# Patient Record
Sex: Male | Born: 1965 | Race: Black or African American | Hispanic: No | Marital: Single | Smoking: Never smoker
Health system: Southern US, Community
[De-identification: ages and names within clinical notes are randomized; demographics above are authoritative.]

## PROBLEM LIST (undated history)

## (undated) DIAGNOSIS — E669 Obesity, unspecified: Secondary | ICD-10-CM

## (undated) DIAGNOSIS — E7800 Pure hypercholesterolemia, unspecified: Secondary | ICD-10-CM

## (undated) DIAGNOSIS — E119 Type 2 diabetes mellitus without complications: Secondary | ICD-10-CM

## (undated) DIAGNOSIS — I10 Essential (primary) hypertension: Secondary | ICD-10-CM

## (undated) HISTORY — PX: KNEE ARTHROCENTESIS: SUR44

## (undated) HISTORY — PX: TONSILLECTOMY: SUR1361

## (undated) HISTORY — PX: OTHER SURGICAL HISTORY: SHX169

---

## 2018-08-28 ENCOUNTER — Emergency Department (HOSPITAL_BASED_OUTPATIENT_CLINIC_OR_DEPARTMENT_OTHER)
Admission: EM | Admit: 2018-08-28 | Discharge: 2018-08-28 | Disposition: A | Discharge status: Home / Self Care | Payer: Medicare HMO | Attending: Emergency Medicine | Admitting: Emergency Medicine

## 2018-08-28 ENCOUNTER — Other Ambulatory Visit: Payer: Self-pay

## 2018-08-28 ENCOUNTER — Encounter (HOSPITAL_BASED_OUTPATIENT_CLINIC_OR_DEPARTMENT_OTHER): Payer: Self-pay

## 2018-08-28 DIAGNOSIS — Z7984 Long term (current) use of oral hypoglycemic drugs: Secondary | ICD-10-CM | POA: Diagnosis not present

## 2018-08-28 DIAGNOSIS — W010XXA Fall on same level from slipping, tripping and stumbling without subsequent striking against object, initial encounter: Secondary | ICD-10-CM | POA: Insufficient documentation

## 2018-08-28 DIAGNOSIS — M542 Cervicalgia: Secondary | ICD-10-CM | POA: Insufficient documentation

## 2018-08-28 DIAGNOSIS — G8929 Other chronic pain: Secondary | ICD-10-CM | POA: Insufficient documentation

## 2018-08-28 DIAGNOSIS — M545 Low back pain: Secondary | ICD-10-CM | POA: Diagnosis not present

## 2018-08-28 DIAGNOSIS — I10 Essential (primary) hypertension: Secondary | ICD-10-CM | POA: Insufficient documentation

## 2018-08-28 DIAGNOSIS — Z79899 Other long term (current) drug therapy: Secondary | ICD-10-CM | POA: Diagnosis not present

## 2018-08-28 DIAGNOSIS — E119 Type 2 diabetes mellitus without complications: Secondary | ICD-10-CM | POA: Diagnosis not present

## 2018-08-28 DIAGNOSIS — M25561 Pain in right knee: Secondary | ICD-10-CM | POA: Insufficient documentation

## 2018-08-28 HISTORY — DX: Type 2 diabetes mellitus without complications: E11.9

## 2018-08-28 HISTORY — DX: Obesity, unspecified: E66.9

## 2018-08-28 HISTORY — DX: Essential (primary) hypertension: I10

## 2018-08-28 HISTORY — DX: Pure hypercholesterolemia, unspecified: E78.00

## 2018-08-28 MED ORDER — KETOROLAC TROMETHAMINE 60 MG/2ML IM SOLN
60.0000 mg | Freq: Once | INTRAMUSCULAR | Status: AC
  Administered 2018-08-28: 60 mg via INTRAMUSCULAR
  Filled 2018-08-28: qty 2

## 2018-08-28 MED ORDER — CYCLOBENZAPRINE HCL 10 MG PO TABS
10.0000 mg | ORAL_TABLET | Freq: Once | ORAL | Status: AC
  Administered 2018-08-28: 10 mg via ORAL
  Filled 2018-08-28: qty 1

## 2018-08-28 MED ORDER — CYCLOBENZAPRINE HCL 10 MG PO TABS: 10.0000 mg | ORAL_TABLET | Freq: Two times a day (BID) | ORAL | 0 refills | Status: DC | PRN

## 2018-08-28 NOTE — Discharge Instructions (Addendum)
Your physical exam looks good today. I have no concerns that your accident today caused any new injuries to your knee, back or neck.  I have prescribed you a muscle relaxer to help with neck pain and muscle spasms.  Continue to take your other medications as prescribed and follow-up with your PCP.  It was a pleasure meeting you today. Thank you for allowing me to take care of you. Enjoy your cruise!

## 2018-08-28 NOTE — ED Notes (Signed)
ED Provider at bedside. 

## 2018-08-28 NOTE — ED Triage Notes (Signed)
Pt states tripped over broken pavement, twisted his body trying not to fall and paritially fell in bush. C/o back, neck, rt hip, rt knee pain

## 2018-08-28 NOTE — ED Provider Notes (Signed)
[facility] EMERGENCY DEPARTMENT Provider Note  CSN: [id number] Arrival date & time: 08/28/18  1616    History   Chief Complaint Chief Complaint  Patient presents with  . Fall    HPI Gregory Hampton is a 52 y.o. male with a medical history of Type 2 DM, HTN and OA who presented to the ED following a fall. Patient reports stumbling on concrete outside his home while gardening. He states that he twisted his body and landed in some bushes. He denies any contact with a hard surface. Currently endorses right knee, low back and neck pain. At baseline, patient has chronic pain in these areas due to arthritis and past injuries and the pain causes him to ambulate with a cane. Denies hearing or feeling any pops, snaps or breaks at the time of the incident. Denies any preceding symptoms that caused the fall such as paresthesias, weakness, vision changes, chest pain, SOB, palpitations, dizziness, lightheadedness or  Syncope. Patient has tried his Rx Tramadol for pain relief prior to arrival with minimal relief.  Past Medical History:  Diagnosis Date  . Diabetes mellitus without complication (HCC)   . High cholesterol   . Hypertension   . Obesity     There are no active problems to display for this patient.   Past Surgical History:  Procedure Laterality Date  . arm surgery    . KNEE ARTHROCENTESIS    . TONSILLECTOMY          Home Medications    Prior to Admission medications   Medication Sig Start Date End Date Taking? Authorizing Provider  atenolol (TENORMIN) 25 MG tablet Take by mouth daily.   Yes [provider]  gabapentin (NEURONTIN) 600 MG tablet Take 600 mg by mouth 3 (three) times daily.   Yes [provider]  hydrochlorothiazide (HYDRODIURIL) 25 MG tablet Take 25 mg by mouth daily.   Yes [provider]  lisinopril (PRINIVIL,ZESTRIL) 5 MG tablet Take 5 mg by mouth daily.   Yes [provider]  lovastatin (MEVACOR) 20 MG tablet  Take 20 mg by mouth at bedtime.   Yes [provider]  metFORMIN (GLUCOPHAGE) 500 MG tablet Take by mouth 2 (two) times daily with a meal.   Yes [provider]  phentermine 37.5 MG capsule Take 37.5 mg by mouth every morning.   Yes [provider]  traMADol (ULTRAM) 50 MG tablet Take by mouth every 6 (six) hours as needed.   Yes [provider]  cyclobenzaprine (FLEXERIL) 10 MG tablet Take 1 tablet (10 mg total) by mouth 2 (two) times daily as needed for muscle spasms. 08/28/18   Raigen Jagielski, Sharyon Medicus, PA-C    Family History No family history on file.  Social History Social History   Tobacco Use  . Smoking status: Never Smoker  . Smokeless tobacco: Never Used  Substance Use Topics  . Alcohol use: Not Currently  . Drug use: Not Currently     Allergies   Patient has no allergy information on record.   Review of Systems Review of Systems  Constitutional: Negative.   Eyes: Negative for visual disturbance.  Respiratory: Negative for chest tightness and shortness of breath.   Cardiovascular: Negative for chest pain and palpitations.  Musculoskeletal: Positive for arthralgias, back pain and neck pain.  Skin: Negative.   Neurological: Negative for dizziness, weakness, light-headedness and numbness.   Physical Exam Updated Vital Signs BP 107/76 (BP Location: Right Arm)   Pulse 76   Temp  98.4 F (36.9 C) (Oral)   Resp 20   SpO2 93%   Physical Exam  Constitutional: Vital signs are normal.  Obese. Sitting comfortably on bed.  Neck: Normal range of motion. Neck supple. Muscular tenderness present. No spinous process tenderness present. Normal range of motion present.  Cardiovascular: Normal rate, regular rhythm and normal heart sounds.  Pulmonary/Chest: Effort normal and breath sounds normal.  Musculoskeletal:  Full ROM in upper and lower extremities bilaterally with 5/5 strength. Negative ligamental laxity tests in right knee. No midline  tenderness of the spine. Right sided paraspinal muscular tenderness of c-spine and lumbar region. Able to bear weight and ambulate with cane as he does normally.  Neurological: He has normal strength and normal reflexes. No sensory deficit. He exhibits normal muscle tone. Gait normal.  Skin: Skin is warm and intact. Capillary refill takes less than 2 seconds. No abrasion and no bruising noted.  Nursing note and vitals reviewed.  ED Treatments / Results  Labs (all labs ordered are listed, but only abnormal results are displayed) Labs Reviewed - No data to display  EKG None  Radiology No results found.  Procedures Procedures (including critical care time)  Medications Ordered in ED Medications  ketorolac (TORADOL) injection 60 mg (60 mg Intramuscular Given 08/28/18 1826)  cyclobenzaprine (FLEXERIL) tablet 10 mg (10 mg Oral Given 08/28/18 1913)     Initial Impression / Assessment and Plan / ED Course  Triage vital signs and the nursing notes have been reviewed.  Pertinent labs & imaging results that were available during care of the patient were reviewed and considered in medical decision making (see chart for details).   Patient presents following a Curator stumble where he twisted his body awkwardly and landed in bushes to avoid falling on the ground. He is currently in no acute distress and is well appearing. At baseline, he has chronic knee, low back and neck pain and his presentation today is a reported acute worsening of these same areas. Physical exam is unremarkable. There are no findings that suggest fracture, dislocation or significant/new injury to these areas that would warrant imaging.  No deformities, decreased muscle tone or other abnormalities visualized. Neurovascular function is intact throughout. There are no other physical exam findings or s/s that suggest an underlying infectious or rheumatologic process that warrant further evaluation or intervention today.  Improvement in pain achieved with IM Toradol and Flexeril today.  Final Clinical Impressions(s) / ED Diagnoses  1. Fall. Associated right knee, neck and back pain. Education provided on OTC and supportive treatment for pain and relief. Advised to continue with home pain medications as prescribed. Advised to follow-up with PCP or orthopedist.  Dispo: Home. After thorough clinical evaluation, this patient is determined to be medically stable and can be safely discharged with the previously mentioned treatment and/or outpatient follow-up/referral(s). At this time, there are no other apparent medical conditions that require further screening, evaluation or treatment.   Final diagnoses:  Chronic pain of right knee  Chronic bilateral low back pain without sciatica  Neck pain, chronic    ED Discharge Orders         Ordered    cyclobenzaprine (FLEXERIL) 10 MG tablet  2 times daily PRN     08/28/18 1944            Ainslee Sou, Roanna Raider 08/28/18 2045    Gwyneth Sprout, MD 08/28/18 [phone]

## 2020-01-21 ENCOUNTER — Emergency Department (HOSPITAL_BASED_OUTPATIENT_CLINIC_OR_DEPARTMENT_OTHER): Payer: Medicare HMO

## 2020-01-21 ENCOUNTER — Other Ambulatory Visit: Payer: Self-pay

## 2020-01-21 ENCOUNTER — Telehealth (HOSPITAL_BASED_OUTPATIENT_CLINIC_OR_DEPARTMENT_OTHER): Payer: Self-pay | Admitting: Emergency Medicine

## 2020-01-21 ENCOUNTER — Encounter (HOSPITAL_BASED_OUTPATIENT_CLINIC_OR_DEPARTMENT_OTHER): Payer: Self-pay | Admitting: Emergency Medicine

## 2020-01-21 ENCOUNTER — Inpatient Hospital Stay (HOSPITAL_BASED_OUTPATIENT_CLINIC_OR_DEPARTMENT_OTHER)
Admission: EM | Admit: 2020-01-21 | Discharge: 2020-02-17 | DRG: 207 | Disposition: E | Payer: Medicare HMO | Attending: Internal Medicine | Admitting: Internal Medicine

## 2020-01-21 DIAGNOSIS — J1282 Pneumonia due to coronavirus disease 2019: Secondary | ICD-10-CM | POA: Diagnosis not present

## 2020-01-21 DIAGNOSIS — E7800 Pure hypercholesterolemia, unspecified: Secondary | ICD-10-CM | POA: Diagnosis present

## 2020-01-21 DIAGNOSIS — E872 Acidosis: Secondary | ICD-10-CM | POA: Diagnosis not present

## 2020-01-21 DIAGNOSIS — I462 Cardiac arrest due to underlying cardiac condition: Secondary | ICD-10-CM | POA: Diagnosis not present

## 2020-01-21 DIAGNOSIS — Z79899 Other long term (current) drug therapy: Secondary | ICD-10-CM | POA: Diagnosis not present

## 2020-01-21 DIAGNOSIS — Z91018 Allergy to other foods: Secondary | ICD-10-CM | POA: Diagnosis not present

## 2020-01-21 DIAGNOSIS — R6521 Severe sepsis with septic shock: Secondary | ICD-10-CM | POA: Diagnosis not present

## 2020-01-21 DIAGNOSIS — Z01818 Encounter for other preprocedural examination: Secondary | ICD-10-CM

## 2020-01-21 DIAGNOSIS — Y848 Other medical procedures as the cause of abnormal reaction of the patient, or of later complication, without mention of misadventure at the time of the procedure: Secondary | ICD-10-CM | POA: Diagnosis not present

## 2020-01-21 DIAGNOSIS — E1169 Type 2 diabetes mellitus with other specified complication: Secondary | ICD-10-CM | POA: Diagnosis present

## 2020-01-21 DIAGNOSIS — L8990 Pressure ulcer of unspecified site, unspecified stage: Secondary | ICD-10-CM | POA: Insufficient documentation

## 2020-01-21 DIAGNOSIS — Z66 Do not resuscitate: Secondary | ICD-10-CM | POA: Diagnosis not present

## 2020-01-21 DIAGNOSIS — E785 Hyperlipidemia, unspecified: Secondary | ICD-10-CM | POA: Diagnosis not present

## 2020-01-21 DIAGNOSIS — I4510 Unspecified right bundle-branch block: Secondary | ICD-10-CM | POA: Diagnosis present

## 2020-01-21 DIAGNOSIS — U071 COVID-19: Principal | ICD-10-CM | POA: Diagnosis present

## 2020-01-21 DIAGNOSIS — L89812 Pressure ulcer of head, stage 2: Secondary | ICD-10-CM | POA: Diagnosis not present

## 2020-01-21 DIAGNOSIS — T4275XA Adverse effect of unspecified antiepileptic and sedative-hypnotic drugs, initial encounter: Secondary | ICD-10-CM | POA: Diagnosis not present

## 2020-01-21 DIAGNOSIS — J80 Acute respiratory distress syndrome: Secondary | ICD-10-CM | POA: Diagnosis not present

## 2020-01-21 DIAGNOSIS — A419 Sepsis, unspecified organism: Secondary | ICD-10-CM | POA: Diagnosis not present

## 2020-01-21 DIAGNOSIS — Z79891 Long term (current) use of opiate analgesic: Secondary | ICD-10-CM

## 2020-01-21 DIAGNOSIS — E669 Obesity, unspecified: Secondary | ICD-10-CM | POA: Diagnosis not present

## 2020-01-21 DIAGNOSIS — J9601 Acute respiratory failure with hypoxia: Secondary | ICD-10-CM | POA: Diagnosis not present

## 2020-01-21 DIAGNOSIS — R0600 Dyspnea, unspecified: Secondary | ICD-10-CM | POA: Diagnosis not present

## 2020-01-21 DIAGNOSIS — Z6841 Body Mass Index (BMI) 40.0 and over, adult: Secondary | ICD-10-CM

## 2020-01-21 DIAGNOSIS — T85598A Other mechanical complication of other gastrointestinal prosthetic devices, implants and grafts, initial encounter: Secondary | ICD-10-CM

## 2020-01-21 DIAGNOSIS — R001 Bradycardia, unspecified: Secondary | ICD-10-CM | POA: Diagnosis not present

## 2020-01-21 DIAGNOSIS — E1165 Type 2 diabetes mellitus with hyperglycemia: Secondary | ICD-10-CM | POA: Diagnosis not present

## 2020-01-21 DIAGNOSIS — M549 Dorsalgia, unspecified: Secondary | ICD-10-CM | POA: Diagnosis present

## 2020-01-21 DIAGNOSIS — E875 Hyperkalemia: Secondary | ICD-10-CM | POA: Diagnosis not present

## 2020-01-21 DIAGNOSIS — I10 Essential (primary) hypertension: Secondary | ICD-10-CM | POA: Diagnosis present

## 2020-01-21 DIAGNOSIS — R000 Tachycardia, unspecified: Secondary | ICD-10-CM | POA: Diagnosis not present

## 2020-01-21 DIAGNOSIS — F329 Major depressive disorder, single episode, unspecified: Secondary | ICD-10-CM | POA: Diagnosis present

## 2020-01-21 DIAGNOSIS — Z7984 Long term (current) use of oral hypoglycemic drugs: Secondary | ICD-10-CM

## 2020-01-21 DIAGNOSIS — J181 Lobar pneumonia, unspecified organism: Secondary | ICD-10-CM | POA: Diagnosis not present

## 2020-01-21 DIAGNOSIS — N179 Acute kidney failure, unspecified: Secondary | ICD-10-CM | POA: Diagnosis not present

## 2020-01-21 DIAGNOSIS — E119 Type 2 diabetes mellitus without complications: Secondary | ICD-10-CM | POA: Diagnosis not present

## 2020-01-21 DIAGNOSIS — J9600 Acute respiratory failure, unspecified whether with hypoxia or hypercapnia: Secondary | ICD-10-CM | POA: Diagnosis not present

## 2020-01-21 DIAGNOSIS — J9382 Other air leak: Secondary | ICD-10-CM | POA: Diagnosis not present

## 2020-01-21 DIAGNOSIS — F419 Anxiety disorder, unspecified: Secondary | ICD-10-CM | POA: Diagnosis present

## 2020-01-21 DIAGNOSIS — R579 Shock, unspecified: Secondary | ICD-10-CM | POA: Diagnosis not present

## 2020-01-21 DIAGNOSIS — J9602 Acute respiratory failure with hypercapnia: Secondary | ICD-10-CM | POA: Diagnosis not present

## 2020-01-21 DIAGNOSIS — Z9911 Dependence on respirator [ventilator] status: Secondary | ICD-10-CM | POA: Diagnosis not present

## 2020-01-21 DIAGNOSIS — G8929 Other chronic pain: Secondary | ICD-10-CM | POA: Diagnosis present

## 2020-01-21 DIAGNOSIS — R0902 Hypoxemia: Secondary | ICD-10-CM

## 2020-01-21 DIAGNOSIS — Z4659 Encounter for fitting and adjustment of other gastrointestinal appliance and device: Secondary | ICD-10-CM

## 2020-01-21 DIAGNOSIS — R319 Hematuria, unspecified: Secondary | ICD-10-CM | POA: Diagnosis not present

## 2020-01-21 DIAGNOSIS — T886XXA Anaphylactic reaction due to adverse effect of correct drug or medicament properly administered, initial encounter: Secondary | ICD-10-CM | POA: Diagnosis not present

## 2020-01-21 DIAGNOSIS — R739 Hyperglycemia, unspecified: Secondary | ICD-10-CM | POA: Diagnosis not present

## 2020-01-21 DIAGNOSIS — E781 Pure hyperglyceridemia: Secondary | ICD-10-CM | POA: Diagnosis present

## 2020-01-21 LAB — COMPREHENSIVE METABOLIC PANEL
ALT: 33 U/L (ref 0–44)
AST: 54 U/L — ABNORMAL HIGH (ref 15–41)
Albumin: 3.6 g/dL (ref 3.5–5.0)
Alkaline Phosphatase: 53 U/L (ref 38–126)
Anion gap: 12 (ref 5–15)
BUN: 15 mg/dL (ref 6–20)
CO2: 27 mmol/L (ref 22–32)
Calcium: 9.6 mg/dL (ref 8.9–10.3)
Chloride: 94 mmol/L — ABNORMAL LOW (ref 98–111)
Creatinine, Ser: 1.26 mg/dL — ABNORMAL HIGH (ref 0.61–1.24)
GFR calc Af Amer: >60 mL/min (ref >60)
GFR calc non Af Amer: >60 mL/min (ref >60)
Glucose, Bld: 154 mg/dL — ABNORMAL HIGH (ref 70–99)
Potassium: 3.7 mmol/L (ref 3.5–5.1)
Sodium: 133 mmol/L — ABNORMAL LOW (ref 135–145)
Total Bilirubin: 0.7 mg/dL (ref 0.3–1.2)
Total Protein: 8.8 g/dL — ABNORMAL HIGH (ref 6.5–8.1)

## 2020-01-21 LAB — HEPATITIS B SURFACE ANTIGEN: Hepatitis B Surface Ag: NONREACTIVE

## 2020-01-21 LAB — CBC WITH DIFFERENTIAL/PLATELET
Abs Immature Granulocytes: 0.1 10*3/uL — ABNORMAL HIGH (ref 0.00–0.07)
Basophils Absolute: 0 10*3/uL (ref 0.0–0.1)
Basophils Relative: 0 %
Eosinophils Absolute: 0 10*3/uL (ref 0.0–0.5)
Eosinophils Relative: 0 %
HCT: 39.2 % (ref 39.0–52.0)
Hemoglobin: 12.2 g/dL — ABNORMAL LOW (ref 13.0–17.0)
Immature Granulocytes: 1 %
Lymphocytes Relative: 5 %
Lymphs Abs: 0.8 10*3/uL (ref 0.7–4.0)
MCH: 29.7 pg (ref 26.0–34.0)
MCHC: 31.1 g/dL (ref 30.0–36.0)
MCV: 95.4 fL (ref 80.0–100.0)
Monocytes Absolute: 1.1 10*3/uL — ABNORMAL HIGH (ref 0.1–1.0)
Monocytes Relative: 8 %
Neutro Abs: 12 10*3/uL — ABNORMAL HIGH (ref 1.7–7.7)
Neutrophils Relative %: 86 %
Platelets: 268 10*3/uL (ref 150–400)
RBC: 4.11 MIL/uL — ABNORMAL LOW (ref 4.22–5.81)
RDW: 13.5 % (ref 11.5–15.5)
WBC: 13.9 10*3/uL — ABNORMAL HIGH (ref 4.0–10.5)
nRBC: 0 % (ref 0.0–0.2)

## 2020-01-21 LAB — PROCALCITONIN: Procalcitonin: 0.5 ng/mL

## 2020-01-21 LAB — GLUCOSE, CAPILLARY
Glucose-Capillary: 141 mg/dL — ABNORMAL HIGH (ref 70–99)
Glucose-Capillary: 159 mg/dL — ABNORMAL HIGH (ref 70–99)
Glucose-Capillary: 203 mg/dL — ABNORMAL HIGH (ref 70–99)
Glucose-Capillary: 157 mg/dL — ABNORMAL HIGH (ref 70–99)

## 2020-01-21 LAB — BRAIN NATRIURETIC PEPTIDE: B Natriuretic Peptide: 29.7 pg/mL (ref 0.0–100.0)

## 2020-01-21 LAB — FIBRINOGEN: Fibrinogen: >800 mg/dL — ABNORMAL HIGH (ref 210–475)

## 2020-01-21 LAB — ABO/RH: ABO/RH(D): O POS

## 2020-01-21 LAB — TRIGLYCERIDES: Triglycerides: 89 mg/dL (ref <150)

## 2020-01-21 LAB — D-DIMER, QUANTITATIVE: D-Dimer, Quant: 0.46 ug/mL-FEU (ref 0.00–0.50)

## 2020-01-21 LAB — C-REACTIVE PROTEIN: CRP: 26.9 mg/dL — ABNORMAL HIGH (ref <1.0)

## 2020-01-21 LAB — FERRITIN: Ferritin: 163 ng/mL (ref 24–336)

## 2020-01-21 LAB — HIV ANTIBODY (ROUTINE TESTING W REFLEX): HIV Screen 4th Generation wRfx: NONREACTIVE

## 2020-01-21 LAB — LACTIC ACID, PLASMA: Lactic Acid, Venous: 1.9 mmol/L (ref 0.5–1.9)

## 2020-01-21 LAB — TROPONIN I (HIGH SENSITIVITY): Troponin I (High Sensitivity): 11 ng/L (ref <18)

## 2020-01-21 LAB — SARS CORONAVIRUS 2 AG (30 MIN TAT): SARS Coronavirus 2 Ag: POSITIVE — AB

## 2020-01-21 LAB — LACTATE DEHYDROGENASE: LDH: 327 U/L — ABNORMAL HIGH (ref 98–192)

## 2020-01-21 MED ORDER — SODIUM CHLORIDE 0.9 % IV SOLN
2.0000 g | INTRAVENOUS | Status: AC
  Administered 2020-01-22 – 2020-01-25 (×4): 2 g via INTRAVENOUS
  Filled 2020-01-21 (×4): qty 20

## 2020-01-21 MED ORDER — INSULIN ASPART 100 UNIT/ML ~~LOC~~ SOLN
0.0000 [IU] | SUBCUTANEOUS | Status: DC
  Administered 2020-01-21: 17:00:00 3 [IU] via SUBCUTANEOUS
  Administered 2020-01-21: 20:00:00 5 [IU] via SUBCUTANEOUS
  Administered 2020-01-21: 15:00:00 2 [IU] via SUBCUTANEOUS
  Administered 2020-01-22 (×3): 3 [IU] via SUBCUTANEOUS
  Administered 2020-01-22: 09:00:00 2 [IU] via SUBCUTANEOUS
  Administered 2020-01-22 – 2020-01-25 (×17): 3 [IU] via SUBCUTANEOUS
  Administered 2020-01-25: 01:00:00 5 [IU] via SUBCUTANEOUS
  Administered 2020-01-26 (×7): 3 [IU] via SUBCUTANEOUS
  Administered 2020-01-27: 08:00:00 5 [IU] via SUBCUTANEOUS
  Administered 2020-01-27: 20:00:00 3 [IU] via SUBCUTANEOUS
  Administered 2020-01-27: 16:00:00 5 [IU] via SUBCUTANEOUS
  Administered 2020-01-27 (×3): 3 [IU] via SUBCUTANEOUS
  Administered 2020-01-28: 09:00:00 11 [IU] via SUBCUTANEOUS
  Administered 2020-01-28: 04:00:00 5 [IU] via SUBCUTANEOUS
  Administered 2020-01-28: 12:00:00 11 [IU] via SUBCUTANEOUS

## 2020-01-21 MED ORDER — SODIUM CHLORIDE 0.9 % IV SOLN
100.0000 mg | INTRAVENOUS | Status: AC
  Administered 2020-01-21 (×2): 100 mg via INTRAVENOUS
  Filled 2020-01-21 (×2): qty 20

## 2020-01-21 MED ORDER — ENOXAPARIN SODIUM 100 MG/ML ~~LOC~~ SOLN
100.0000 mg | Freq: Once | SUBCUTANEOUS | Status: AC
  Administered 2020-01-21: 14:00:00 100 mg via SUBCUTANEOUS
  Filled 2020-01-21: qty 1

## 2020-01-21 MED ORDER — ATENOLOL 25 MG PO TABS
25.0000 mg | ORAL_TABLET | Freq: Every day | ORAL | Status: DC
  Administered 2020-01-21 – 2020-01-25 (×5): 25 mg via ORAL
  Filled 2020-01-21 (×6): qty 1

## 2020-01-21 MED ORDER — METHYLPREDNISOLONE SODIUM SUCC 125 MG IJ SOLR
70.0000 mg | Freq: Three times a day (TID) | INTRAMUSCULAR | Status: DC
  Administered 2020-01-21 – 2020-01-27 (×17): 70 mg via INTRAVENOUS
  Filled 2020-01-21 (×17): qty 2

## 2020-01-21 MED ORDER — ASCORBIC ACID 500 MG PO TABS
500.0000 mg | ORAL_TABLET | Freq: Every day | ORAL | Status: DC
  Administered 2020-01-21 – 2020-01-25 (×5): 500 mg via ORAL
  Filled 2020-01-21 (×5): qty 1

## 2020-01-21 MED ORDER — SODIUM CHLORIDE 0.9 % IV SOLN
100.0000 mg | Freq: Every day | INTRAVENOUS | Status: AC
  Administered 2020-01-22 – 2020-01-25 (×4): 100 mg via INTRAVENOUS
  Filled 2020-01-21 (×4): qty 20

## 2020-01-21 MED ORDER — PANTOPRAZOLE SODIUM 40 MG PO TBEC
40.0000 mg | DELAYED_RELEASE_TABLET | Freq: Every day | ORAL | Status: DC
  Administered 2020-01-21 – 2020-01-25 (×5): 40 mg via ORAL
  Filled 2020-01-21 (×5): qty 1

## 2020-01-21 MED ORDER — POLYETHYLENE GLYCOL 3350 17 G PO PACK
17.0000 g | PACK | Freq: Every day | ORAL | Status: DC | PRN
  Administered 2020-01-28: 11:00:00 17 g via ORAL
  Filled 2020-01-21: qty 1

## 2020-01-21 MED ORDER — DEXAMETHASONE SODIUM PHOSPHATE 10 MG/ML IJ SOLN
6.0000 mg | Freq: Once | INTRAMUSCULAR | Status: AC
  Administered 2020-01-21: 6 mg via INTRAVENOUS
  Filled 2020-01-21: qty 1

## 2020-01-21 MED ORDER — ZINC SULFATE 220 (50 ZN) MG PO CAPS
220.0000 mg | ORAL_CAPSULE | Freq: Every day | ORAL | Status: DC
  Administered 2020-01-21 – 2020-01-25 (×5): 220 mg via ORAL
  Filled 2020-01-21 (×5): qty 1

## 2020-01-21 MED ORDER — LINAGLIPTIN 5 MG PO TABS
5.0000 mg | ORAL_TABLET | Freq: Every day | ORAL | Status: DC
  Administered 2020-01-21 – 2020-01-25 (×5): 5 mg via ORAL
  Filled 2020-01-21 (×5): qty 1

## 2020-01-21 MED ORDER — PRAVASTATIN SODIUM 20 MG PO TABS
20.0000 mg | ORAL_TABLET | Freq: Every day | ORAL | Status: DC
  Administered 2020-01-21 – 2020-01-23 (×3): 20 mg via ORAL
  Filled 2020-01-21: qty 1
  Filled 2020-01-21 (×3): qty 2

## 2020-01-21 MED ORDER — ENOXAPARIN SODIUM 100 MG/ML ~~LOC~~ SOLN
100.0000 mg | Freq: Two times a day (BID) | SUBCUTANEOUS | Status: DC
  Administered 2020-01-22 – 2020-01-26 (×9): 100 mg via SUBCUTANEOUS
  Filled 2020-01-21 (×14): qty 1

## 2020-01-21 MED ORDER — CITALOPRAM HYDROBROMIDE 20 MG PO TABS
20.0000 mg | ORAL_TABLET | Freq: Every day | ORAL | Status: DC
  Administered 2020-01-21 – 2020-01-24 (×4): 20 mg via ORAL
  Filled 2020-01-21: qty 2
  Filled 2020-01-21: qty 1
  Filled 2020-01-21: qty 2
  Filled 2020-01-21: qty 1
  Filled 2020-01-21: qty 2

## 2020-01-21 MED ORDER — SODIUM CHLORIDE 0.9 % IV SOLN
INTRAVENOUS | Status: AC
  Administered 2020-01-21: 500 mg via INTRAVENOUS
  Filled 2020-01-21: qty 500

## 2020-01-21 MED ORDER — SODIUM CHLORIDE 0.9% IV SOLUTION: Freq: Once | INTRAVENOUS | Status: AC

## 2020-01-21 MED ORDER — SODIUM CHLORIDE 0.9 % IV SOLN
500.0000 mg | Freq: Once | INTRAVENOUS | Status: AC
  Administered 2020-01-21: 11:00:00 500 mg via INTRAVENOUS

## 2020-01-21 MED ORDER — METHYLPREDNISOLONE SODIUM SUCC 125 MG IJ SOLR
60.0000 mg | Freq: Three times a day (TID) | INTRAMUSCULAR | Status: DC
  Filled 2020-01-21: qty 2

## 2020-01-21 MED ORDER — TOCILIZUMAB 400 MG/20ML IV SOLN
800.0000 mg | Freq: Once | INTRAVENOUS | Status: AC
  Administered 2020-01-21: 800 mg via INTRAVENOUS
  Filled 2020-01-21: qty 40

## 2020-01-21 MED ORDER — ACETAMINOPHEN 325 MG PO TABS
650.0000 mg | ORAL_TABLET | Freq: Four times a day (QID) | ORAL | Status: DC | PRN
  Administered 2020-01-21 – 2020-02-01 (×13): 650 mg via ORAL
  Filled 2020-01-21 (×12): qty 2

## 2020-01-21 MED ORDER — ACETAMINOPHEN 325 MG PO TABS
650.0000 mg | ORAL_TABLET | Freq: Once | ORAL | Status: AC
  Administered 2020-01-21: 650 mg via ORAL
  Filled 2020-01-21: qty 2

## 2020-01-21 MED ORDER — AZITHROMYCIN 500 MG PO TABS
500.0000 mg | ORAL_TABLET | Freq: Every day | ORAL | Status: AC
  Administered 2020-01-22 – 2020-01-25 (×4): 500 mg via ORAL
  Filled 2020-01-21: qty 2
  Filled 2020-01-21: qty 1
  Filled 2020-01-21 (×2): qty 2

## 2020-01-21 MED ORDER — SODIUM CHLORIDE 0.9 % IV SOLN
1.0000 g | Freq: Once | INTRAVENOUS | Status: AC
  Administered 2020-01-21: 1 g via INTRAVENOUS
  Filled 2020-01-21: qty 10

## 2020-01-21 NOTE — ED Notes (Signed)
Pt sister Hady Niemczyk picked up pt home meds 02/14/2020 at 15:45

## 2020-01-21 NOTE — ED Triage Notes (Signed)
Presents with SOB, seen at PCP yesterday, worse shortness of breath today with chest pain.

## 2020-01-21 NOTE — Progress Notes (Signed)
RT evaluated for need of heated high flow pt per request of Dr. Lowell Guitar. Pt on 15L HFNC and 15L NRB. SpO2 94%, RR 24, HR 98. Pt in no distress at this time. RT will continue to monitor.

## 2020-01-21 NOTE — ED Provider Notes (Signed)
[facility] EMERGENCY DEPARTMENT Provider Note   CSN: [id number] Arrival date & time: 2020-02-12  7564     History No chief complaint on file.   Gregory Hampton is a 54 y.o. male.  He has a history of diabetes and hypertension high cholesterol.  Not normally require oxygen.  Went to his primary care doctor yesterday for cough and shortness of breath and found to be hypoxic.  Doctor recommended he come to the emergency department but the patient declined.  Sats were in the 60s and 70s at home so used his mother's oxygen.  Tank ran out this morning so he decided to come to the emergency department.  Complaining of headache and shortness of breath.  No chest pain.  Nonproductive cough.  No runny nose sore throat or diarrhea.  No known Covid exposures.  The history is provided by the patient.  Shortness of Breath Severity:  Severe Onset quality:  Gradual Duration:  2 days Timing:  Constant Progression:  Worsening Chronicity:  New Context: URI   Relieved by:  Nothing Worsened by:  Activity Ineffective treatments:  Oxygen Associated symptoms: cough and headaches   Associated symptoms: no abdominal pain, no chest pain, no fever, no hemoptysis, no rash, no sore throat, no sputum production, no syncope and no vomiting   Risk factors: obesity   Risk factors: no hx of PE/DVT        Past Medical History:  Diagnosis Date  . Diabetes mellitus without complication (HCC)   . High cholesterol   . Hypertension   . Obesity     There are no problems to display for this patient.   Past Surgical History:  Procedure Laterality Date  . arm surgery    . KNEE ARTHROCENTESIS    . TONSILLECTOMY         No family history on file.  Social History   Tobacco Use  . Smoking status: Never Smoker  . Smokeless tobacco: Never Used  Substance Use Topics  . Alcohol use: Not Currently  . Drug use: Not Currently    Home Medications Prior to Admission medications   Medication Sig  Start Date End Date Taking? Authorizing Provider  atenolol (TENORMIN) 25 MG tablet Take by mouth daily.    [provider]  cyclobenzaprine (FLEXERIL) 10 MG tablet Take 1 tablet (10 mg total) by mouth 2 (two) times daily as needed for muscle spasms. 08/28/18   Mortis, Jerrel Ivory I, PA-C  gabapentin (NEURONTIN) 600 MG tablet Take 600 mg by mouth 3 (three) times daily.    [provider]  hydrochlorothiazide (HYDRODIURIL) 25 MG tablet Take 25 mg by mouth daily.    [provider]  lisinopril (PRINIVIL,ZESTRIL) 5 MG tablet Take 5 mg by mouth daily.    [provider]  lovastatin (MEVACOR) 20 MG tablet Take 20 mg by mouth at bedtime.    [provider]  metFORMIN (GLUCOPHAGE) 500 MG tablet Take by mouth 2 (two) times daily with a meal.    [provider]  phentermine 37.5 MG capsule Take 37.5 mg by mouth every morning.    [provider]  traMADol (ULTRAM) 50 MG tablet Take by mouth every 6 (six) hours as needed.    [provider]    Allergies    Patient has no allergy information on record.  Review of Systems   Review of Systems  Constitutional: Negative for fever.  HENT: Negative for sore throat.   Eyes: Negative for visual disturbance.  Respiratory: Positive for cough and shortness of breath. Negative for hemoptysis and sputum production.   Cardiovascular: Negative for chest pain and syncope.  Gastrointestinal: Negative for abdominal pain and vomiting.  Genitourinary: Negative for dysuria.  Musculoskeletal: Positive for back pain.  Skin: Negative for rash.  Neurological: Positive for headaches.    Physical Exam Updated Vital Signs BP 129/86 (BP Location: Right Arm)   Pulse 90   Temp 98.9 F (37.2 C) (Oral)   Resp (!) 28   Ht 5\' 10"  (1.778 m)   Wt (!) 203.2 kg   SpO2 95%   BMI 64.28 kg/m   Physical Exam Vitals and nursing note reviewed.  Constitutional:      Appearance: He is well-developed. He is obese.   HENT:     Head: Normocephalic and atraumatic.  Eyes:     Conjunctiva/sclera: Conjunctivae normal.  Cardiovascular:     Rate and Rhythm: Regular rhythm. Tachycardia present.     Pulses: Normal pulses.     Heart sounds: No murmur.  Pulmonary:     Effort: Tachypnea, accessory muscle usage and respiratory distress present.     Breath sounds: Decreased breath sounds present.  Abdominal:     Palpations: Abdomen is soft.     Tenderness: There is no abdominal tenderness.  Musculoskeletal:     Cervical back: Neck supple.  Skin:    General: Skin is warm and dry.  Neurological:     Mental Status: He is alert.     ED Results / Procedures / Treatments   Labs (all labs ordered are listed, but only abnormal results are displayed) Labs Reviewed  SARS CORONAVIRUS 2 AG (30 MIN TAT) - Abnormal; Notable for the following components:      Result Value   SARS Coronavirus 2 Ag POSITIVE (*)    All other components within normal limits  CBC WITH DIFFERENTIAL/PLATELET - Abnormal; Notable for the following components:   WBC 13.9 (*)    RBC 4.11 (*)    Hemoglobin 12.2 (*)    Neutro Abs 12.0 (*)    Monocytes Absolute 1.1 (*)    Abs Immature Granulocytes 0.10 (*)    All other components within normal limits  COMPREHENSIVE METABOLIC PANEL - Abnormal; Notable for the following components:   Sodium 133 (*)    Chloride 94 (*)    Glucose, Bld 154 (*)    Creatinine, Ser 1.26 (*)    Total Protein 8.8 (*)    AST 54 (*)    All other components within normal limits  LACTATE DEHYDROGENASE - Abnormal; Notable for the following components:   LDH 327 (*)    All other components within normal limits  FIBRINOGEN - Abnormal; Notable for the following components:   Fibrinogen >800 (*)    All other components within normal limits  C-REACTIVE PROTEIN - Abnormal; Notable for the following components:   CRP 26.9 (*)    All other components within normal limits  GLUCOSE, CAPILLARY - Abnormal; Notable for the  following components:   Glucose-Capillary 141 (*)    All other components within normal limits  GLUCOSE, CAPILLARY - Abnormal; Notable for the following components:   Glucose-Capillary 159 (*)    All other components within normal limits  CULTURE, BLOOD (ROUTINE X 2)  CULTURE, BLOOD (ROUTINE X 2)  LACTIC ACID, PLASMA  D-DIMER, QUANTITATIVE (NOT AT [facility])  PROCALCITONIN  FERRITIN  TRIGLYCERIDES  BRAIN NATRIURETIC PEPTIDE  HIV ANTIBODY (ROUTINE TESTING W REFLEX)  HEPATITIS B SURFACE ANTIGEN  HEPATITIS B SURFACE ANTIBODY, QUANTITATIVE  CBC WITH DIFFERENTIAL/PLATELET  COMPREHENSIVE METABOLIC PANEL  C-REACTIVE PROTEIN  D-DIMER, QUANTITATIVE (NOT AT [facility])  FERRITIN  MAGNESIUM  PHOSPHORUS  ABO/RH  PREPARE FRESH FROZEN PLASMA  TROPONIN I (HIGH SENSITIVITY)    EKG EKG Interpretation  Date/Time:  Tuesday January 21 2020 09:01:43 EST Ventricular Rate:  122 PR Interval:    QRS Duration: 133 QT Interval:  340 QTC Calculation: 485 R Axis:   -58 Text Interpretation: Sinus tachycardia Probable left atrial enlargement Right bundle branch block ST elevation, consider inferior injury No old tracing to compare Confirmed by Aletta Edouard [phone]) on 02/14/2020 9:09:34 AM   Radiology DG Chest Port 1 View  Result Date: 01/31/2020 CLINICAL DATA:  Shortness of breath seen a PCP with worsening shortness of breath today. EXAM: PORTABLE CHEST 1 VIEW COMPARISON:  None FINDINGS: Study is highly limited by portable technique and patient body habitus. Also in lordotic projection. Heart size likely enlarged but lung volumes are low. Interstitial and airspace opacity seen bilaterally worse in the mid and lower chest. Left hemidiaphragm is obscured, partial obscuration of right hemidiaphragm. Retrocardiac opacification obscures a portion of the left heart border as well. No acute bone finding. IMPRESSION: Limited study. Interstitial and airspace opacity seen bilaterally worse in the mid and lower chest.  Findings could be seen in the setting of multifocal/atypical pneumonia or in the setting of heart failure. More dense consolidative changes are present at the bases worse on the left. Electronically Signed   By: Zetta Bills M.D.   On: 02/03/2020 09:28    Procedures .Critical Care Performed by: Hayden Rasmussen, MD Authorized by: Hayden Rasmussen, MD   Critical care provider statement:    Critical care time (minutes):  45   Critical care time was exclusive of:  Separately billable procedures and treating other patients   Critical care was necessary to treat or prevent imminent or life-threatening deterioration of the following conditions:  Respiratory failure   Critical care was time spent personally by me on the following activities:  Discussions with consultants, evaluation of patient's response to treatment, examination of patient, ordering and performing treatments and interventions, ordering and review of laboratory studies, ordering and review of radiographic studies, pulse oximetry, re-evaluation of patient's condition, obtaining history from patient or surrogate, review of old charts and development of treatment plan with patient or surrogate   I assumed direction of critical care for this patient from another provider in my specialty: no     (including critical care time)  Medications Ordered in ED Medications  cefTRIAXone (ROCEPHIN) 1 g in sodium chloride 0.9 % 100 mL IVPB (1 g Intravenous New Bag/Given 01/31/2020 1039)  azithromycin (ZITHROMAX) 500 mg in sodium chloride 0.9 % 250 mL IVPB (has no administration in time range)  remdesivir 100 mg in sodium chloride 0.9 % 100 mL IVPB (has no administration in time range)  remdesivir 100 mg in sodium chloride 0.9 % 100 mL IVPB (has no administration in time range)  sodium chloride 0.9 % with azithromycin (ZITHROMAX) ADS Med (has no administration in time range)  dexamethasone (DECADRON) injection 6 mg (6 mg Intravenous Given 01/29/2020  0919)  acetaminophen (TYLENOL) tablet 650 mg (650 mg Oral Given 02/05/2020 0919)    ED Course  I have reviewed the triage vital signs and the nursing notes.  Pertinent labs & imaging results that were available during my care of the patient were reviewed by me and considered in my medical decision making (  see chart for details).  Clinical Course as of Jan 20 1702  Tue Jan 21, 2020  0907 Differential includes Covid, pneumonia, CHF, PE, pneumothorax   [MB]  0907 Currently on 15 L high flow with sats in the low 90s.   [MB]  [id number] Patient hypoxic to the 60s on room air.  Placed on nonrebreather.  Distant breath sounds probably due to habitus.  Febrile here Tylenol ordered.  Presumptive Covid so ordered Decadron.   [MB]  [id number] Chest x-ray interpreted by me is very poor penetration.  CHF versus multifocal pneumonia.   [MB]  [id number] EKG is sinus tachycardia with a right bundle branch block.  No prior EKGs available.   [MB]  1001 DG Chest Port 1 View [MB]  1001 Radiology reading of chest x-ray as multifocal pneumonia versus CHF.  White blood cell count elevated at 13.9.  Chemistry showing an elevation of creatinine 1.26 no priors to compare with.  Also slight elevation of AST.  Lactate not elevated along with D-dimer not elevated.   [MB]  1017 Discussed with Dr. Lowell Guitar [facility] hospitalist covering [facility].  He is recommending starting the patient on remdesivir and he is arranged for the patient to get a progressive bed at [facility].   [MB]  1154 [facility] here to transport the patient to [facility].   [MB]    Clinical Course User Index [MB] Terrilee Files, MD   MDM Rules/Calculators/A&P                     Kebin Maye was evaluated in Emergency Department on 01/20/2020 for the symptoms described in the history of present illness. He was evaluated in the context of the global COVID-19 pandemic, which necessitated consideration that the patient might be at risk for infection with  the SARS-CoV-2 virus that causes COVID-19. Institutional protocols and algorithms that pertain to the evaluation of patients at risk for COVID-19 are in a state of rapid change based on information released by regulatory bodies including the CDC and federal and state organizations. These policies and algorithms were followed during the patient's care in the ED.  Final Clinical Impression(s) / ED Diagnoses Final diagnoses:  Acute respiratory failure due to COVID-19 [facility])    Rx / DC Orders ED Discharge Orders    None       Terrilee Files, MD 02/16/2020 1705

## 2020-01-21 NOTE — Plan of Care (Signed)
  Problem: Education: Goal: Knowledge of risk factors and measures for prevention of condition will improve Outcome: Progressing   Problem: Coping: Goal: Psychosocial and spiritual needs will be supported Outcome: Progressing   Problem: Respiratory: Goal: Will maintain a patent airway Outcome: Progressing Goal: Complications related to the disease process, condition or treatment will be avoided or minimized Outcome: Progressing   

## 2020-01-21 NOTE — ED Notes (Signed)
Attempted calling report to [employer], RN unavailable

## 2020-01-21 NOTE — Progress Notes (Signed)
Pt is morbidly obese. Dr. Lowell Guitar would like to put patient on ICU dose of Lovenox for DVT prophylaxis for now. Ddimer<5   Lovenox 100mg  SQ BID  , PharmD, [place], AAHIVP, CPP Infectious Disease Pharmacist 02/20/2020 1:55 PM

## 2020-01-21 NOTE — ED Notes (Signed)
Report called to [place], RN at [facility].

## 2020-01-21 NOTE — Progress Notes (Signed)
History and Physical    [name]:[id number] DOB: Jan 30, 1966 DOA: Jan 31, 2020  PCP: Quitman Livings, MD  Patient coming from: home  I have personally briefly reviewed patient's old medical records in [facility]  Chief Complaint: shortness of breath  HPI: Gregory Hampton is Gregory Hampton 54 y.o. male with medical history significant of T2DM, HTN, obesity, HLD presenting with shortness of breath.  Symptoms started last Friday.  Was prescribed abx, steroids.  He presented again on Monday to his PCP and was hypoxic.  His PCP recommended he go to the ED, but he declined and went home and used his mothers oxygen.  He had persistent SOB and presented to the ED with O2 sats in the 60's on RA.  He notes pleuritic CP, SOB, fever.  Denies change in taste/smell, diarrhea, abdominal symptoms.  He denies appetite.  Denies smoking or etoh use.       ED Course: Labs, CXR, EKG, steroids, remdesivir, abx.   Review of Systems: As per HPI otherwise 10 point review of systems negative.   Past Medical History:  Diagnosis Date  . Diabetes mellitus without complication (HCC)   . High cholesterol   . Hypertension   . Obesity     Past Surgical History:  Procedure Laterality Date  . arm surgery    . KNEE ARTHROCENTESIS    . TONSILLECTOMY       reports that he has never smoked. He has never used smokeless tobacco. He reports previous alcohol use. He reports previous drug use.  No Known Allergies  No family history on file.  Prior to Admission medications   Medication Sig Start Date End Date Taking? Authorizing Provider  HYDROcodone-acetaminophen (NORCO) 10-325 MG tablet Take 1 tablet by mouth 4 (four) times daily as needed for severe pain.    Yes [provider]  sulfamethoxazole-trimethoprim (BACTRIM DS) 800-160 MG tablet Take 1 tablet by mouth 2 (two) times daily. 01/17/20 01/26/20 Yes [provider]  atenolol (TENORMIN) 25 MG tablet Take by mouth daily.    [provider]  citalopram (CELEXA) 20 MG tablet Take 20 mg by mouth daily.    [provider]  cyclobenzaprine (FLEXERIL) 10 MG tablet Take 1 tablet (10 mg total) by mouth 2 (two) times daily as needed for muscle spasms. 08/28/18   Mortis, Jerrel Ivory I, PA-C  gabapentin (NEURONTIN) 600 MG tablet Take 600 mg by mouth 3 (three) times daily.    [provider]  hydrochlorothiazide (HYDRODIURIL) 25 MG tablet Take 25 mg by mouth daily.    [provider]  HYDROcodone-acetaminophen (NORCO/VICODIN) 5-325 MG tablet Take 1 tablet by mouth 4 (four) times daily as needed for moderate pain.    [provider]  lisinopril (PRINIVIL,ZESTRIL) 5 MG tablet Take 5 mg by mouth daily.    [provider]  lovastatin (MEVACOR) 20 MG tablet Take 20 mg by mouth at bedtime.    [provider]  metFORMIN (GLUCOPHAGE) 500 MG tablet Take by mouth 2 (two) times daily with Megahn Killings meal.    [provider]  phentermine 37.5 MG capsule Take 37.5 mg by mouth every morning.    [provider]  traMADol (ULTRAM) 50 MG tablet Take by mouth every 6 (six) hours as needed.    [provider]    Physical Exam: Vitals:   2020-01-31 1045 2020-01-31 1113 31-Jan-2020 1130 01-31-20 1300  BP:  120/71 125/79 (!) 130/91  Pulse: (!) 110 (!) 107 (!) 104 100  Resp: Marland Kitchen)  38 (!) 44 (!) 39 (!) 22  Temp:      TempSrc:      SpO2: (!) 89% 91% (!) 89% 95%  Weight:      Height:        Constitutional: NAD, calm, comfortable Vitals:   02-04-2020 1045 02-04-20 1113 February 04, 2020 1130 02/04/2020 1300  BP:  120/71 125/79 (!) 130/91  Pulse: (!) 110 (!) 107 (!) 104 100  Resp: (!) 38 (!) 44 (!) 39 (!) 22  Temp:      TempSrc:      SpO2: (!) 89% 91% (!) 89% 95%  Weight:      Height:       Eyes: PERRL, lids and conjunctivae normal ENMT: Mucous membranes are moist. Posterior pharynx clear of any exudate or lesions.Normal dentition.  Neck: normal, supple, no masses, no thyromegaly Respiratory:  tachypneic on 15 L [place], no adventitious lung sounds appreciated Cardiovascular: Regular rate and rhythm, no murmurs / rubs / gallops. No extremity edema  Abdomen: no tenderness, no masses palpated. No hepatosplenomegaly. Bowel sounds positive.  Musculoskeletal: no clubbing / cyanosis. No joint deformity upper and lower extremities. Good ROM, no contractures. Normal muscle tone.  Skin: no rashes, lesions, ulcers. No induration Neurologic: CN 2-12 grossly intact. Sensation intact.  Moving all extremities.  Psychiatric: Normal judgment and insight. Alert and oriented x 3. Normal mood.   Labs on Admission: I have personally reviewed following labs and imaging studies  CBC: Recent Labs  Lab 02-04-20 0913  WBC 13.9*  NEUTROABS 12.0*  HGB 12.2*  HCT 39.2  MCV 95.4  PLT 268   Basic Metabolic Panel: Recent Labs  Lab Feb 04, 2020 0913  NA 133*  K 3.7  CL 94*  CO2 27  GLUCOSE 154*  BUN 15  CREATININE 1.26*  CALCIUM 9.6   GFR: Estimated Creatinine Clearance: 118.6 mL/min (Myesha Stillion) (by C-G formula based on SCr of 1.26 mg/dL (H)). Liver Function Tests: Recent Labs  Lab 02-04-20 0913  AST 54*  ALT 33  ALKPHOS 53  BILITOT 0.7  PROT 8.8*  ALBUMIN 3.6   No results for input(s): LIPASE, AMYLASE in the last 168 hours. No results for input(s): AMMONIA in the last 168 hours. Coagulation Profile: No results for input(s): INR, PROTIME in the last 168 hours. Cardiac Enzymes: No results for input(s): CKTOTAL, CKMB, CKMBINDEX, TROPONINI in the last 168 hours. BNP (last 3 results) No results for input(s): PROBNP in the last 8760 hours. HbA1C: No results for input(s): HGBA1C in the last 72 hours. CBG: No results for input(s): GLUCAP in the last 168 hours. Lipid Profile: Recent Labs    02-04-20 0913  TRIG 89   Thyroid Function Tests: No results for input(s): TSH, T4TOTAL, FREET4, T3FREE, THYROIDAB in the last 72 hours. Anemia Panel: Recent Labs    Feb 04, 2020 0913  FERRITIN 163   Urine  analysis: No results found for: COLORURINE, APPEARANCEUR, LABSPEC, PHURINE, GLUCOSEU, HGBUR, BILIRUBINUR, KETONESUR, PROTEINUR, UROBILINOGEN, NITRITE, LEUKOCYTESUR  Radiological Exams on Admission: DG Chest Port 1 View  Result Date: 02-04-2020 CLINICAL DATA:  Shortness of breath seen Linsay Vogt PCP with worsening shortness of breath today. EXAM: PORTABLE CHEST 1 VIEW COMPARISON:  None FINDINGS: Study is highly limited by portable technique and patient body habitus. Also in lordotic projection. Heart size likely enlarged but lung volumes are low. Interstitial and airspace opacity seen bilaterally worse in the mid and lower chest. Left hemidiaphragm is obscured, partial obscuration of right hemidiaphragm. Retrocardiac opacification obscures Ranelle Auker portion of the left heart  border as well. No acute bone finding. IMPRESSION: Limited study. Interstitial and airspace opacity seen bilaterally worse in the mid and lower chest. Findings could be seen in the setting of multifocal/atypical pneumonia or in the setting of heart failure. More dense consolidative changes are present at the bases worse on the left. Electronically Signed   By: Zetta Bills M.D.   On: January 30, 2020 09:28    EKG: Independently reviewed. Sinus tachy, RBBB, no priors for comparison  Assessment/Plan Active Problems:   Pneumonia due to COVID-19 virus   Acute Hypoxic Respiratory Failure 2/2 COVID 19 Pneumonia Currently on 15 L HFNC and NRB Steroids, remdesivir Discussed risk/benefits, off label use of actemra.  Discussed unclear evidence.  No hx TB or hepatitis.  He was agreeable after discussion of risks/benefitis.  Ordered 2/2.  Convalescent plasma ordered 2/2 after discussion of risks/benefits.  Pt looked over and signed consent form. Procalcitonin is 0.5, will continue abx for possible bacterial pneumonia, though suspect this is unlikely  I/O, daily weights IS, OOB, prone as able CRP significantly elevated, follow  COVID-19 Labs  Recent Labs     01-30-20 0913  DDIMER 0.46  FERRITIN 163  LDH 327*  CRP 26.9*    No results found for: SARSCOV2NAA  Type 2 Diabetes: metformin at home.  Follow A1c.  SSI for now, follow closely.  Hypertension: continue atenolol.  Hold lisinopril and HCTZ for now, consider resumption tomorrow.  HLD: continue statin  Depression  Anxiety: continue celexa  DVT prophylaxis: BID lovenox given critical illness Code Status: full  Family Communication: none at bedside Disposition Plan: pending further improvemetn Consults called: none  Admission status: inpatient  50 min critical care time given AHRF 2/2 COVID 19 with significant hypoxia requiring 15 L [place] + NRB  Fayrene Helper MD [facility] Pager AMION  If 7PM-7AM, please contact night-coverage [email or web address] Password [facility]  2020-01-30, 2:32 PM

## 2020-01-21 NOTE — Progress Notes (Addendum)
Called for transfer to [facility] 54 yo with HTN, diabetes, HLD, morbid obesity presented to PCP yesterday and was found to be hypoxic with cough and SOB.  He declined going to the ED and used his mothers oxygen.  He came to ED after tank ran out.  Satting 63% on RA on presentation, now on 15 L HFNC satting 95%.  Vitals notable for tachycardia to 110's and tachypnea (most recently 26).  Normotensive.  Febrile.  CXR with evidence of multifocal/atypical pneumonia.  Labs notable for Na 133, Creatinine 1.26, AST 54, WBC 13.9, Hb 12.2, D dimer 0.46.  Normal BNP, troponin, and lactate.  Pending CRP, fibrinogen, triglycerides, ferritin, LDH, procalcitonin.  Blood cultures pending as well.  Discussed with Dr. Charm Barges, plan for patient to be admitted to [facility].  Giving steroids, remdesivir, and abx at [facility].  Doesn't sound like they have actemra at [facility], but would discuss this and plasma with patient on arrival to [facility].

## 2020-01-21 NOTE — ED Notes (Signed)
Portable [place]ox 63% on room air. Placed on NRB in room 6.  SpO2 increased into mid 90s,  RR 35-45, placed on HFNC after CXR. SpO2 94%.

## 2020-01-22 ENCOUNTER — Encounter (HOSPITAL_COMMUNITY): Payer: Self-pay | Admitting: Family Medicine

## 2020-01-22 DIAGNOSIS — J9600 Acute respiratory failure, unspecified whether with hypoxia or hypercapnia: Secondary | ICD-10-CM

## 2020-01-22 DIAGNOSIS — E119 Type 2 diabetes mellitus without complications: Secondary | ICD-10-CM

## 2020-01-22 DIAGNOSIS — Z6841 Body Mass Index (BMI) 40.0 and over, adult: Secondary | ICD-10-CM

## 2020-01-22 DIAGNOSIS — E1169 Type 2 diabetes mellitus with other specified complication: Secondary | ICD-10-CM | POA: Diagnosis present

## 2020-01-22 DIAGNOSIS — I10 Essential (primary) hypertension: Secondary | ICD-10-CM | POA: Diagnosis present

## 2020-01-22 DIAGNOSIS — E785 Hyperlipidemia, unspecified: Secondary | ICD-10-CM | POA: Diagnosis present

## 2020-01-22 LAB — GLUCOSE, CAPILLARY
Glucose-Capillary: 144 mg/dL — ABNORMAL HIGH (ref 70–99)
Glucose-Capillary: 153 mg/dL — ABNORMAL HIGH (ref 70–99)
Glucose-Capillary: 167 mg/dL — ABNORMAL HIGH (ref 70–99)
Glucose-Capillary: 167 mg/dL — ABNORMAL HIGH (ref 70–99)
Glucose-Capillary: 168 mg/dL — ABNORMAL HIGH (ref 70–99)
Glucose-Capillary: 193 mg/dL — ABNORMAL HIGH (ref 70–99)

## 2020-01-22 LAB — COMPREHENSIVE METABOLIC PANEL
ALT: 35 U/L (ref 0–44)
AST: 54 U/L — ABNORMAL HIGH (ref 15–41)
Albumin: 3.5 g/dL (ref 3.5–5.0)
Alkaline Phosphatase: 58 U/L (ref 38–126)
Anion gap: 11 (ref 5–15)
BUN: 23 mg/dL — ABNORMAL HIGH (ref 6–20)
CO2: 28 mmol/L (ref 22–32)
Calcium: 9.5 mg/dL (ref 8.9–10.3)
Chloride: 99 mmol/L (ref 98–111)
Creatinine, Ser: 1.11 mg/dL (ref 0.61–1.24)
GFR calc Af Amer: >60 mL/min (ref >60)
GFR calc non Af Amer: >60 mL/min (ref >60)
Glucose, Bld: 163 mg/dL — ABNORMAL HIGH (ref 70–99)
Potassium: 4.4 mmol/L (ref 3.5–5.1)
Sodium: 138 mmol/L (ref 135–145)
Total Bilirubin: 0.3 mg/dL (ref 0.3–1.2)
Total Protein: 9.1 g/dL — ABNORMAL HIGH (ref 6.5–8.1)

## 2020-01-22 LAB — CBC WITH DIFFERENTIAL/PLATELET
Abs Immature Granulocytes: 0.1 10*3/uL — ABNORMAL HIGH (ref 0.00–0.07)
Basophils Absolute: 0 10*3/uL (ref 0.0–0.1)
Basophils Relative: 0 %
Eosinophils Absolute: 0 10*3/uL (ref 0.0–0.5)
Eosinophils Relative: 0 %
HCT: 40.8 % (ref 39.0–52.0)
Hemoglobin: 13.4 g/dL (ref 13.0–17.0)
Immature Granulocytes: 1 %
Lymphocytes Relative: 6 %
Lymphs Abs: 0.8 10*3/uL (ref 0.7–4.0)
MCH: 31.3 pg (ref 26.0–34.0)
MCHC: 32.8 g/dL (ref 30.0–36.0)
MCV: 95.3 fL (ref 80.0–100.0)
Monocytes Absolute: 0.6 10*3/uL (ref 0.1–1.0)
Monocytes Relative: 4 %
Neutro Abs: 13.8 10*3/uL — ABNORMAL HIGH (ref 1.7–7.7)
Neutrophils Relative %: 89 %
Platelets: 296 10*3/uL (ref 150–400)
RBC: 4.28 MIL/uL (ref 4.22–5.81)
RDW: 13.6 % (ref 11.5–15.5)
WBC: 15.4 10*3/uL — ABNORMAL HIGH (ref 4.0–10.5)
nRBC: 0 % (ref 0.0–0.2)

## 2020-01-22 LAB — BPAM FFP
Blood Product Expiration Date: 202102031655
ISSUE DATE / TIME: 202102021742
Unit Type and Rh: 9500

## 2020-01-22 LAB — C-REACTIVE PROTEIN: CRP: 29.4 mg/dL — ABNORMAL HIGH (ref <1.0)

## 2020-01-22 LAB — PREPARE FRESH FROZEN PLASMA: Unit division: 0

## 2020-01-22 LAB — HEPATITIS B SURFACE ANTIBODY, QUANTITATIVE: Hep B S AB Quant (Post): <3.1 m[IU]/mL — ABNORMAL LOW (ref >9.9)

## 2020-01-22 LAB — D-DIMER, QUANTITATIVE: D-Dimer, Quant: 0.31 ug/mL-FEU (ref 0.00–0.50)

## 2020-01-22 LAB — MAGNESIUM: Magnesium: 2.3 mg/dL (ref 1.7–2.4)

## 2020-01-22 LAB — FERRITIN: Ferritin: 262 ng/mL (ref 24–336)

## 2020-01-22 LAB — PHOSPHORUS: Phosphorus: 4.8 mg/dL — ABNORMAL HIGH (ref 2.5–4.6)

## 2020-01-22 MED ORDER — SALINE SPRAY 0.65 % NA SOLN
1.0000 | NASAL | Status: DC | PRN
  Administered 2020-01-22: 19:00:00 1 via NASAL
  Filled 2020-01-22: qty 44

## 2020-01-22 MED ORDER — FUROSEMIDE 10 MG/ML IJ SOLN
40.0000 mg | Freq: Once | INTRAMUSCULAR | Status: AC
  Administered 2020-01-23: 40 mg via INTRAVENOUS
  Filled 2020-01-22: qty 4

## 2020-01-22 MED ORDER — OXYMETAZOLINE HCL 0.05 % NA SOLN
1.0000 | Freq: Two times a day (BID) | NASAL | Status: DC | PRN
  Administered 2020-01-22: 19:00:00 1 via NASAL
  Filled 2020-01-22: qty 15

## 2020-01-22 NOTE — Progress Notes (Signed)
PROGRESS NOTE  Gregory Hampton  FUX:[id number] DOB: 08/15/1966 DOA: 01/24/2020 PCP: Quitman Livings, MD   Brief Narrative: Gregory Hampton is a 54 y.o. male with a history of T2DM, HTN, obesity, HLD who developed shortness of breath 1/25, started on antibiotics and steroids by PCP and noted to have hypoxemia at home for which he used his mother's home oxygen until symptoms worsened on 2/2 for which he presented to the ED.He was found to be hypoxic requiring 15L NRB with positive SARS-CoV-2 antigen. CRP 26, bilateral CXR infiltrates. Remdesivir and steroids started, tocilizumab and convalescent plasma ordered 2/2 and he was admitted to [facility].  Assessment & Plan: Active Problems:   Pneumonia due to COVID-19 virus  Acute hypoxemic respiratory failure due to covid-19 pneumonia: SARS-CoV-2 Ag positive on 2/2.  - Continues to require 15LPM O2. No baseline hypoxemia. Wean oxygen as tolerated - Continue remdesivir x5 days 2/2 - 2/6 - Steroids x10 days 2/2 - 2/11 - Continue empiric abx due to elevated PCT, having received tocilizumab 2/2. Hep B SAg NR. - Agree with convalescent plasma. - Vitamin C, zinc - Encourage OOB, IS, FV, and awake proning if able - Tylenol and antitussives prn - Continue airborne, contact precautions while admitted. Isolation period would be recommended for 21 days from positive testing. - Check CBC w/diff, CMP, CRP daily  T2DM:  - Check HbA1c. - Continue SSI  HTN:  - Continue atenolol. Holding lisinopril, HCTZ  Hyperlipidemia:  - Continue statin  Depression, anxiety:  - Continue SSRI  Morbid obesity: BMI 64. Noted.   DVT prophylaxis: Lovenox 0.5mg /kg q12h intermediate dose due to increased risk of VTE.  Code Status: Full Family Communication: None at bedside, pt to relay POC. Disposition Plan: From home, likely to return home pending clinical progress. Specifically needs to see improvement in covid pneumonia such that hypoxemia only requires 2L O2 or less,  currently at 15L.   Consultants:   None  Procedures:   None  Antimicrobials:  Remdesivir, ceftriaxone, azithromycin 2/2 - 2/6   Subjective: Feels much better than yesterday, still requiring significant oxygen and moderately, constantly dyspneic with exertion. No chest or other pain.   Objective: Vitals:   01/22/20 0731 01/22/20 0900 01/22/20 0950 01/22/20 1000  BP:      Pulse:  (!) 103 (!) 103 (!) 101  Resp:      Temp:      TempSrc:      SpO2: 90% 99% (!) 65% 90%  Weight:      Height:        Intake/Output Summary (Last 24 hours) at 01/22/2020 1030 Last data filed at 01/22/2020 1000 Gross per 24 hour  Intake 1913.22 ml  Output 1630 ml  Net 283.22 ml   Filed Weights   02/15/2020 0906 01/20/2020 0910  Weight: (!) 202.8 kg (!) 203.2 kg    Gen: Pleasant obese male in no distress Pulm: Non-labored breathing 15LPM. Scant crackles bilaterally.  CV: Regular rate and rhythm. No murmur, rub, or gallop. No definite JVD, no pitting LE edema. GI: Abdomen soft, non-tender, non-distended, with normoactive bowel sounds. No organomegaly or masses felt. Ext: Warm, no deformities Skin: No rashes, lesions or ulcers on visualized skin. Neuro: Alert and oriented. No focal neurological deficits. Psych: Judgement and insight appear normal. Mood & affect appropriate.   Data Reviewed: I have personally reviewed following labs and imaging studies  CBC: Recent Labs  Lab 01/23/2020 0913 01/22/20 0205  WBC 13.9* 15.4*  NEUTROABS 12.0* 13.8*  HGB 12.2* 13.4  HCT 39.2 40.8  MCV 95.4 95.3  PLT 268 893   Basic Metabolic Panel: Recent Labs  Lab 01/31/2020 0913 01/22/20 0205  NA 133* 138  K 3.7 4.4  CL 94* 99  CO2 27 28  GLUCOSE 154* 163*  BUN 15 23*  CREATININE 1.26* 1.11  CALCIUM 9.6 9.5  MG  --  2.3  PHOS  --  4.8*   GFR: Estimated Creatinine Clearance: 134.6 mL/min (by C-G formula based on SCr of 1.11 mg/dL). Liver Function Tests: Recent Labs  Lab 02/13/2020 0913 01/22/20 0205   AST 54* 54*  ALT 33 35  ALKPHOS 53 58  BILITOT 0.7 0.3  PROT 8.8* 9.1*  ALBUMIN 3.6 3.5   No results for input(s): LIPASE, AMYLASE in the last 168 hours. No results for input(s): AMMONIA in the last 168 hours. Coagulation Profile: No results for input(s): INR, PROTIME in the last 168 hours. Cardiac Enzymes: No results for input(s): CKTOTAL, CKMB, CKMBINDEX, TROPONINI in the last 168 hours. BNP (last 3 results) No results for input(s): PROBNP in the last 8760 hours. HbA1C: No results for input(s): HGBA1C in the last 72 hours. CBG: Recent Labs  Lab 01/25/2020 1936 02/06/2020 2039 02/02/2020 2343 01/22/20 0353 01/22/20 0754  GLUCAP 203* 157* 153* 167* 144*   Lipid Profile: Recent Labs    02/07/2020 0913  TRIG 89   Thyroid Function Tests: No results for input(s): TSH, T4TOTAL, FREET4, T3FREE, THYROIDAB in the last 72 hours. Anemia Panel: Recent Labs    02/05/2020 0913 01/22/20 0205  FERRITIN 163 262   Urine analysis: No results found for: COLORURINE, APPEARANCEUR, LABSPEC, PHURINE, GLUCOSEU, HGBUR, BILIRUBINUR, KETONESUR, PROTEINUR, UROBILINOGEN, NITRITE, LEUKOCYTESUR Recent Results (from the past 240 hour(s))  SARS Coronavirus 2 Ag (30 min TAT) -     Status: Abnormal   Collection Time: 02/07/2020  9:13 AM  Result Value Ref Range Status   SARS Coronavirus 2 Ag POSITIVE (A) NEGATIVE Final    Comment: RESULT CALLED TO, READ BACK BY AND VERIFIED WITH: MARVA SIMMS, RN @ 8101 01/22/2020 BY O COLEMAN (NOTE) SARS-CoV-2 antigen PRESENT. Positive results indicate the presence of viral antigens, but clinical correlation with patient history and other diagnostic information is necessary to determine patient infection status.  Positive results do not rule out bacterial infection or co-infection  with other viruses. False positive results are rare but can occur, and confirmatory RT-PCR testing may be appropriate in some circumstances. The expected result is Negative. Fact Sheet for  Patients: PodPark.tn Fact Sheet for Providers: GiftContent.is  This test is not yet approved or cleared by the [place] FDA and  has been authorized for detection and/or diagnosis of SARS-CoV-2 by FDA under an Emergency Use Authorization (EUA).  This EUA will remain in effect (meaning this test can be used) for the duration of  the COVID-19  declaration under Section 564(b)(1) of the Act, 21 U.S.C. section 360bbb-3(b)(1), unless the authorization is terminated or revoked sooner. Performed at [facility], [address], [place], [place] [address]       Radiology Studies: DG Chest [place] 1 View  Result Date: 01/27/2020 CLINICAL DATA:  Shortness of breath seen a PCP with worsening shortness of breath today. EXAM: PORTABLE CHEST 1 VIEW COMPARISON:  None FINDINGS: Study is highly limited by portable technique and patient body habitus. Also in lordotic projection. Heart size likely enlarged but lung volumes are low. Interstitial and airspace opacity seen bilaterally worse in the mid and lower chest. Left hemidiaphragm  is obscured, partial obscuration of right hemidiaphragm. Retrocardiac opacification obscures a portion of the left heart border as well. No acute bone finding. IMPRESSION: Limited study. Interstitial and airspace opacity seen bilaterally worse in the mid and lower chest. Findings could be seen in the setting of multifocal/atypical pneumonia or in the setting of heart failure. More dense consolidative changes are present at the bases worse on the left. Electronically Signed   By: Donzetta Kohut M.D.   On: 02-07-20 09:28    Scheduled Meds: . vitamin C  500 mg Oral Daily  . atenolol  25 mg Oral Daily  . azithromycin  500 mg Oral Daily  . citalopram  20 mg Oral QHS  . enoxaparin (LOVENOX) injection  100 mg Subcutaneous Q12H  . insulin aspart  0-15 Units Subcutaneous Q4H  . linagliptin  5 mg Oral Daily  .  methylPREDNISolone (SOLU-MEDROL) injection  70 mg Intravenous Q8H  . pantoprazole  40 mg Oral Daily  . pravastatin  20 mg Oral q1800  . zinc sulfate  220 mg Oral Daily   Continuous Infusions: . cefTRIAXone (ROCEPHIN)  IV 2 g (01/22/20 0955)  . remdesivir 100 mg in NS 100 mL 100 mg (01/22/20 0902)     LOS: 1 day   Time spent: 35 minutes.  Tyrone Nine, MD [facility] [email or web address] 01/22/2020, 10:30 AM

## 2020-01-22 NOTE — Progress Notes (Signed)
Spoke to RT, Alecia Lemming), requested I check with Dr Dwana Curd about changing pt to Heated Hight Flow, as I requested Alecia Lemming to eval pt and place him on above. Pts RR 30's, Sats dipping into 70's, at rest.  Page/text sent to Dr Dwana Curd just now, will await his response.

## 2020-01-22 NOTE — Plan of Care (Signed)
Went over plan of care with pt. All questions asked and all questions answered. 

## 2020-01-22 NOTE — Plan of Care (Signed)
  Problem: Education: Goal: Knowledge of risk factors and measures for prevention of condition will improve Outcome: Progressing   Problem: Coping: Goal: Psychosocial and spiritual needs will be supported Outcome: Progressing   Problem: Respiratory: Goal: Will maintain a patent airway Outcome: Progressing Goal: Complications related to the disease process, condition or treatment will be avoided or minimized Outcome: Progressing   

## 2020-01-23 ENCOUNTER — Inpatient Hospital Stay (HOSPITAL_COMMUNITY): Payer: Medicare HMO

## 2020-01-23 DIAGNOSIS — J9601 Acute respiratory failure with hypoxia: Secondary | ICD-10-CM

## 2020-01-23 LAB — CBC WITH DIFFERENTIAL/PLATELET
Abs Immature Granulocytes: 0.19 10*3/uL — ABNORMAL HIGH (ref 0.00–0.07)
Basophils Absolute: 0 10*3/uL (ref 0.0–0.1)
Basophils Relative: 0 %
Eosinophils Absolute: 0 10*3/uL (ref 0.0–0.5)
Eosinophils Relative: 0 %
HCT: 43.4 % (ref 39.0–52.0)
Hemoglobin: 13.7 g/dL (ref 13.0–17.0)
Immature Granulocytes: 1 %
Lymphocytes Relative: 6 %
Lymphs Abs: 1.2 10*3/uL (ref 0.7–4.0)
MCH: 29.7 pg (ref 26.0–34.0)
MCHC: 31.6 g/dL (ref 30.0–36.0)
MCV: 93.9 fL (ref 80.0–100.0)
Monocytes Absolute: 1.3 10*3/uL — ABNORMAL HIGH (ref 0.1–1.0)
Monocytes Relative: 6 %
Neutro Abs: 19 10*3/uL — ABNORMAL HIGH (ref 1.7–7.7)
Neutrophils Relative %: 87 %
Platelets: 351 10*3/uL (ref 150–400)
RBC: 4.62 MIL/uL (ref 4.22–5.81)
RDW: 13.5 % (ref 11.5–15.5)
WBC: 21.8 10*3/uL — ABNORMAL HIGH (ref 4.0–10.5)
nRBC: 0.1 % (ref 0.0–0.2)

## 2020-01-23 LAB — COMPREHENSIVE METABOLIC PANEL
ALT: 39 U/L (ref 0–44)
AST: 48 U/L — ABNORMAL HIGH (ref 15–41)
Albumin: 3.6 g/dL (ref 3.5–5.0)
Alkaline Phosphatase: 59 U/L (ref 38–126)
Anion gap: 15 (ref 5–15)
BUN: 38 mg/dL — ABNORMAL HIGH (ref 6–20)
CO2: 28 mmol/L (ref 22–32)
Calcium: 9.7 mg/dL (ref 8.9–10.3)
Chloride: 96 mmol/L — ABNORMAL LOW (ref 98–111)
Creatinine, Ser: 1.16 mg/dL (ref 0.61–1.24)
GFR calc Af Amer: >60 mL/min (ref >60)
GFR calc non Af Amer: >60 mL/min (ref >60)
Glucose, Bld: 155 mg/dL — ABNORMAL HIGH (ref 70–99)
Potassium: 3.9 mmol/L (ref 3.5–5.1)
Sodium: 139 mmol/L (ref 135–145)
Total Bilirubin: 0.8 mg/dL (ref 0.3–1.2)
Total Protein: 9 g/dL — ABNORMAL HIGH (ref 6.5–8.1)

## 2020-01-23 LAB — GLUCOSE, CAPILLARY
Glucose-Capillary: 168 mg/dL — ABNORMAL HIGH (ref 70–99)
Glucose-Capillary: 153 mg/dL — ABNORMAL HIGH (ref 70–99)
Glucose-Capillary: 163 mg/dL — ABNORMAL HIGH (ref 70–99)
Glucose-Capillary: 161 mg/dL — ABNORMAL HIGH (ref 70–99)
Glucose-Capillary: 186 mg/dL — ABNORMAL HIGH (ref 70–99)
Glucose-Capillary: 193 mg/dL — ABNORMAL HIGH (ref 70–99)

## 2020-01-23 LAB — C-REACTIVE PROTEIN: CRP: 15.7 mg/dL — ABNORMAL HIGH (ref <1.0)

## 2020-01-23 LAB — HEMOGLOBIN A1C
Hgb A1c MFr Bld: 7 % — ABNORMAL HIGH (ref 4.8–5.6)
Mean Plasma Glucose: 154.2 mg/dL

## 2020-01-23 MED ORDER — CHLORHEXIDINE GLUCONATE CLOTH 2 % EX PADS
6.0000 | MEDICATED_PAD | Freq: Every day | CUTANEOUS | Status: DC
  Administered 2020-01-24 – 2020-02-01 (×9): 6 via TOPICAL

## 2020-01-23 MED ORDER — FUROSEMIDE 10 MG/ML IJ SOLN
80.0000 mg | Freq: Two times a day (BID) | INTRAMUSCULAR | Status: DC
  Administered 2020-01-23 – 2020-01-26 (×7): 80 mg via INTRAVENOUS
  Filled 2020-01-23 (×9): qty 8

## 2020-01-23 MED ORDER — FUROSEMIDE 10 MG/ML IJ SOLN: 40.0000 mg | Freq: Every day | INTRAMUSCULAR | Status: DC

## 2020-01-23 MED ORDER — HYDROCODONE-ACETAMINOPHEN 5-325 MG PO TABS
1.0000 | ORAL_TABLET | Freq: Four times a day (QID) | ORAL | Status: DC | PRN
  Administered 2020-01-23 – 2020-01-24 (×5): 1 via ORAL
  Filled 2020-01-23 (×5): qty 1

## 2020-01-23 NOTE — Progress Notes (Signed)
0400-Returned to bed from [facility], patient self proned. Patient on Hifl and 100% NRB, PO2 sat down to 82% with activity. 0430-PO2 SATs between 82-85%, patient appears comfortable. Call to RT do to PO2 sats still down, per RT increase to 45L on HIFL and that he takes a while to recover.

## 2020-01-23 NOTE — Progress Notes (Signed)
Called and updated patient's sister, Cordelia Pen on patient condition and plan of care. All questions answered.

## 2020-01-23 NOTE — Progress Notes (Signed)
Pt moved to room 170, with RT along as well as this RN. Report to [facility], returning from break. Excell Seltzer (RN) at bedside as well, covering for accepting RN in room (ChristineRN) as well.  Pt VS of 119/82 (92), 95-108 NSR/ST, RR 30-34, Sat 82%. Report to [facility] verbally.

## 2020-01-23 NOTE — Consult Note (Signed)
NAME:  Gregory Hampton, MRN:  [id number], DOB:  1966-10-26, LOS: 2 ADMISSION DATE:  02-14-2020, CONSULTATION DATE: 01/23/2020 REFERRING MD: Hazeline Junker, MD, CHIEF COMPLAINT: COVID-19 pneumonia  Brief History   54 year old with diabetes, obesity, hypertension, hyperlipidemia admitted on 2/2 with severe COVID-19 pneumonia Treated with steroids, remdesivir, Tocilizumab and convalescent plasma.  PCCM consulted on 2/4 for worsening oxygen requirement  Past Medical History    has a past medical history of Diabetes mellitus without complication (HCC), High cholesterol, Hypertension, and Obesity.  Significant Hospital Events   2/2 Admit   Consults:  PCCM  Procedures:    Significant Diagnostic Tests:  Chest x-ray 2/2-multifocal airspace opacities  Micro Data:    Antimicrobials/COVID RX  Convalescent plasma 2/2 Tocilizumab 2/2  Azithromycin 2/2 >> Ceftriaxone 2/2 >>  Solu-Medrol 2/2 >> Remdesivir 2/2 >>  Interim history/subjective:    Objective   Blood pressure 115/80, pulse (!) 104, temperature 97.6 F (36.4 C), resp. rate 14, height 5\' 10"  (1.778 m), weight (!) 203.2 kg, SpO2 (!) 89 %.    FiO2 (%):  [100 %] 100 %   Intake/Output Summary (Last 24 hours) at 01/23/2020 1045 Last data filed at 01/23/2020 0200 Gross per 24 hour  Intake 400 ml  Output 1670 ml  Net -1270 ml   Filed Weights   02-14-20 0906 02/14/2020 0910  Weight: (!) 202.8 kg (!) 203.2 kg    Examination: Blood pressure 115/80, pulse (!) 104, temperature 97.6 F (36.4 C), resp. rate 14, height 5\' 10"  (1.778 m), weight (!) 203.2 kg, SpO2 (!) 89 %. Gen:      No acute distress, obese, prone HEENT:  EOMI, sclera anicteric Neck:     No masses; no thyromegaly Lungs:    Clear to auscultation bilaterally; normal respiratory effort CV:         Regular rate and rhythm; no murmurs Abd:      + bowel sounds; soft, non-tender; no palpable masses, no distension Ext:    No edema; adequate peripheral perfusion Skin:       Warm and dry; no rash Neuro: alert and oriented x 3 Psych: normal mood and affect  Resolved Hospital Problem list     Assessment & Plan:  Severe COVID-19 pneumonia Received full treatment options including steroids, remdesivir, plasma and Tocilizumab Agree with current dose of Solu-Medrol as he will require higher dose due to his obesity Lasix Awake proning, incentive spirometer High risk for decompensation and needing intubation.  Will monitor closely Full code  Best practice:  Diet: N.p.o. Pain/Anxiety/Delirium protocol (if indicated): NA VAP protocol (if indicated): NA DVT prophylaxis: Lovenox GI prophylaxis: Protonix Glucose control: SSI, Tradjenta Mobility: Bed Code Status: Full Family Communication: Per primary team Disposition: PCU  Labs   CBC: Recent Labs  Lab 2020/02/14 0913 01/22/20 0205 01/23/20 0345  WBC 13.9* 15.4* 21.8*  NEUTROABS 12.0* 13.8* 19.0*  HGB 12.2* 13.4 13.7  HCT 39.2 40.8 43.4  MCV 95.4 95.3 93.9  PLT 268 296 351    Basic Metabolic Panel: Recent Labs  Lab 02/14/2020 0913 01/22/20 0205 01/23/20 0345  NA 133* 138 139  K 3.7 4.4 3.9  CL 94* 99 96*  CO2 27 28 28   GLUCOSE 154* 163* 155*  BUN 15 23* 38*  CREATININE 1.26* 1.11 1.16  CALCIUM 9.6 9.5 9.7  MG  --  2.3  --   PHOS  --  4.8*  --    GFR: Estimated Creatinine Clearance: 128.8 mL/min (by C-G formula based on SCr of  1.16 mg/dL). Recent Labs  Lab February 04, 2020 0913 01/22/20 0205 01/23/20 0345  PROCALCITON 0.50  --   --   WBC 13.9* 15.4* 21.8*  LATICACIDVEN 1.9  --   --     Liver Function Tests: Recent Labs  Lab February 04, 2020 0913 01/22/20 0205 01/23/20 0345  AST 54* 54* 48*  ALT 33 35 39  ALKPHOS 53 58 59  BILITOT 0.7 0.3 0.8  PROT 8.8* 9.1* 9.0*  ALBUMIN 3.6 3.5 3.6   No results for input(s): LIPASE, AMYLASE in the last 168 hours. No results for input(s): AMMONIA in the last 168 hours.  ABG No results found for: PHART, PCO2ART, PO2ART, HCO3, TCO2, ACIDBASEDEF, O2SAT     Coagulation Profile: No results for input(s): INR, PROTIME in the last 168 hours.  Cardiac Enzymes: No results for input(s): CKTOTAL, CKMB, CKMBINDEX, TROPONINI in the last 168 hours.  HbA1C: Hgb A1c MFr Bld  Date/Time Value Ref Range Status  01/23/2020 03:45 AM 7.0 (H) 4.8 - 5.6 % Final    Comment:    (NOTE) Pre diabetes:          5.7%-6.4% Diabetes:              >6.4% Glycemic control for   <7.0% adults with diabetes     CBG: Recent Labs  Lab 01/22/20 1643 01/22/20 2023 01/22/20 2341 01/23/20 0508 01/23/20 0800  GLUCAP 168* 168* 167* 153* 163*    Review of Systems:   REVIEW OF SYSTEMS:   All negative; except for those that are bolded, which indicate positives.  Constitutional: weight loss, weight gain, night sweats, fevers, chills, fatigue, weakness.  HEENT: headaches, sore throat, sneezing, nasal congestion, post nasal drip, difficulty swallowing, tooth/dental problems, visual complaints, visual changes, ear aches. Neuro: difficulty with speech, weakness, numbness, ataxia. CV:  chest pain, orthopnea, PND, swelling in lower extremities, dizziness, palpitations, syncope.  Resp: cough, hemoptysis, dyspnea, wheezing. GI: heartburn, indigestion, abdominal pain, nausea, vomiting, diarrhea, constipation, change in bowel habits, loss of appetite, hematemesis, melena, hematochezia.  GU: dysuria, change in color of urine, urgency or frequency, flank pain, hematuria. MSK: joint pain or swelling, decreased range of motion. Psych: change in mood or affect, depression, anxiety, suicidal ideations, homicidal ideations. Skin: rash, itching, bruising.  Past Medical History  He,  has a past medical history of Diabetes mellitus without complication ([place]), High cholesterol, Hypertension, and Obesity.   Surgical History    Past Surgical History:  Procedure Laterality Date  . arm surgery    . KNEE ARTHROCENTESIS    . TONSILLECTOMY       Social History   reports that he has  never smoked. He has never used smokeless tobacco. He reports previous alcohol use. He reports previous drug use.   Family History   His family history is not on file.   Allergies Allergies  Allergen Reactions  . Mushroom Extract Complex Rash     Home Medications  Prior to Admission medications   Medication Sig Start Date End Date Taking? Authorizing Provider  atenolol (TENORMIN) 25 MG tablet Take 25 mg by mouth daily.    Yes [provider]  citalopram (CELEXA) 20 MG tablet Take 20 mg by mouth at bedtime.    Yes [provider]  hydrochlorothiazide (HYDRODIURIL) 25 MG tablet Take 25 mg by mouth daily.   Yes [provider]  HYDROcodone-acetaminophen (NORCO) 10-325 MG tablet Take 1 tablet by mouth 4 (four) times daily as needed for severe pain.    Yes [provider]  HYDROcodone-acetaminophen (NORCO/VICODIN) 5-325 MG tablet Take 1 tablet by mouth 4 (four) times daily as needed for moderate pain.   Yes [provider]  lisinopril (PRINIVIL,ZESTRIL) 5 MG tablet Take 5 mg by mouth daily.   Yes [provider]  lovastatin (MEVACOR) 20 MG tablet Take 20 mg by mouth at bedtime.    Yes [provider]  metFORMIN (GLUCOPHAGE) 500 MG tablet Take 500 mg by mouth 2 (two) times daily with a meal.    Yes [provider]  Multiple Vitamin (MULTIVITAMIN WITH MINERALS) TABS tablet Take 1 tablet by mouth daily.   Yes [provider]  Phenyleph-Doxylamine-DM-APAP (ALKA SELTZER PLUS PO) Take 2 tablets by mouth 2 (two) times daily as needed (pain).   Yes [provider]  predniSONE (STERAPRED UNI-PAK 21 TAB) 10 MG (21) TBPK tablet Take 10-60 mg by mouth See admin instructions. Take 60 mg on day 1, 50 mg on day 2, take 40 mg on day 3, take 30 mg on day 4, take 20 mg on day 5, and 10 mg on day 6, then stop   Yes [provider]  sulfamethoxazole-trimethoprim (BACTRIM DS) 800-160 MG tablet Take 1 tablet by mouth 2  (two) times daily. 01/17/20 01/26/20 Yes [provider]  Vitamin D, Ergocalciferol, (DRISDOL) 1.25 MG (50000 UNIT) CAPS capsule Take 50,000 Units by mouth every Monday.    Yes [provider]     Critical care time:     The patient is critically ill with multiple organ system failure and requires high complexity decision making for assessment and support, frequent evaluation and titration of therapies, advanced monitoring, review of radiographic studies and interpretation of complex data.   Critical Care Time devoted to patient care services, exclusive of separately billable procedures, described in this note is 35 minutes.   [name] MD [facility] Please see [email or web address] for pager details.  01/23/2020, 11:38 AM

## 2020-01-23 NOTE — Progress Notes (Addendum)
PROGRESS NOTE  Gregory Hampton  PPI:[id number] DOB: 03/10/1966 DOA: February 14, 2020 PCP: Quitman Livings, MD   Brief Narrative: Gregory Hampton is a 54 y.o. male with a history of T2DM, HTN, obesity, HLD who developed shortness of breath 1/25, started on antibiotics and steroids by PCP and noted to have hypoxemia at home for which he used his mother's home oxygen until symptoms worsened on 2/2 for which he presented to the ED.He was found to be hypoxic requiring 15L NRB with positive SARS-CoV-2 antigen. CRP 26, bilateral CXR infiltrates. Remdesivir and steroids started, tocilizumab and convalescent plasma ordered 2/2 and he was admitted to [facility].  Assessment & Plan: Active Problems:   Pneumonia due to COVID-19 virus   Diabetes mellitus without complication (HCC)   Acute respiratory failure with hypoxia (HCC)   Hypertension   Type 2 diabetes mellitus with hyperlipidemia (HCC)   Morbid obesity with BMI of 60.0-69.9, adult (HCC)  Acute hypoxemic respiratory failure due to covid-19 pneumonia: SARS-CoV-2 Ag positive on 2/2. Symptoms 1/29, so at high risk of continued decompensation. - Increasing oxygen requirement to [facility] 50LPM 100% FiO2, still SpO2 at rest <85%. Patient is amenable to mechanical ventilation, though is not currently in respiratory distress. Will ask for pulmonology evaluation, consider transfer to ICU.  - Continue remdesivir x5 days 2/2 - 2/6 - Steroids x10 days 2/2 - 2/11 - Continue empiric abx due to elevated PCT, having received tocilizumab 2/2. Hep B SAg NR. CRP noted to be improving.  - Convalescent plasma ordered - Vitamin C, zinc - Encourage OOB, IS, FV, and awake proning if able - Tylenol and antitussives prn - Continue airborne, contact precautions while admitted. Isolation period would be recommended for 21 days from positive testing. - Check CBC w/diff, CMP, CRP daily - Continue IV lasix with edema, monitor I/O, daily weights. Repeat CXR.  T2DM: HbA1c 7% - Continue  SSI  HTN:  - Continue atenolol. Holding lisinopril, HCTZ  Hyperlipidemia:  - Continue statin  Depression, anxiety:  - Continue SSRI  Morbid obesity: BMI 64. Noted.   Chronic back pain:  - Will reorder home dose hydrocodone.   DVT prophylaxis: Lovenox 0.5mg /kg q12h intermediate dose due to increased risk of VTE.  Code Status: Full Family Communication: None at bedside, pt to relay POC. Disposition Plan: From home, likely to return home pending improvement in hypoxemic respiratory failure.  Consultants:   PCCM  Procedures:   None  Antimicrobials:  Remdesivir, ceftriaxone, azithromycin 2/2 - 2/6   Subjective: Hypoxemia worsened dramatically over last 24 hours, though patient feels subjectively stable, less weak. Dyspnea is moderate, severe with exertion, but not as severe as SpO2 would suggest. Having chest tightness that is not described as exertional. Trace swelling in legs.  Objective: Vitals:   01/23/20 0200 01/23/20 0300 01/23/20 0727 01/23/20 0821  BP: 113/75 (!) 140/93 115/80   Pulse: 100 (!) 111 (!) 104   Resp: (!) 29 (!) 41 14   Temp:  (!) 97.4 F (36.3 C) 97.6 F (36.4 C)   TempSrc:  Oral    SpO2: 90% 92% (!) 84% (!) 89%  Weight:      Height:        Intake/Output Summary (Last 24 hours) at 01/23/2020 1026 Last data filed at 01/23/2020 0200 Gross per 24 hour  Intake 400 ml  Output 1670 ml  Net -1270 ml   Filed Weights   2020/02/14 0906 02/14/20 0910  Weight: (!) 202.8 kg (!) 203.2 kg   Gen: 54 y.o. male in  no distress Pulm: Mildly labored on HHF at rest, SpO2 75-85%. Diminished diffusely, no wheezing. CV: Regular tachycardia. No murmur, rub, or gallop. No JVD, trace dependent edema. GI: Abdomen soft, non-tender, non-distended, with normoactive bowel sounds.  Ext: Warm, no deformities Skin: No new rashes, lesions or ulcers on visualized skin. Neuro: Alert and oriented. No focal neurological deficits. Psych: Judgement and insight appear fair. Mood  euthymic & affect congruent. Behavior is appropriate.    Data Reviewed: I have personally reviewed following labs and imaging studies  CBC: Recent Labs  Lab 02/06/2020 0913 01/22/20 0205 01/23/20 0345  WBC 13.9* 15.4* 21.8*  NEUTROABS 12.0* 13.8* 19.0*  HGB 12.2* 13.4 13.7  HCT 39.2 40.8 43.4  MCV 95.4 95.3 93.9  PLT 268 296 351   Basic Metabolic Panel: Recent Labs  Lab 02/16/2020 0913 01/22/20 0205 01/23/20 0345  NA 133* 138 139  K 3.7 4.4 3.9  CL 94* 99 96*  CO2 27 28 28   GLUCOSE 154* 163* 155*  BUN 15 23* 38*  CREATININE 1.26* 1.11 1.16  CALCIUM 9.6 9.5 9.7  MG  --  2.3  --   PHOS  --  4.8*  --    GFR: Estimated Creatinine Clearance: 128.8 mL/min (by C-G formula based on SCr of 1.16 mg/dL). Liver Function Tests: Recent Labs  Lab 02/14/2020 0913 01/22/20 0205 01/23/20 0345  AST 54* 54* 48*  ALT 33 35 39  ALKPHOS 53 58 59  BILITOT 0.7 0.3 0.8  PROT 8.8* 9.1* 9.0*  ALBUMIN 3.6 3.5 3.6   No results for input(s): LIPASE, AMYLASE in the last 168 hours. No results for input(s): AMMONIA in the last 168 hours. Coagulation Profile: No results for input(s): INR, PROTIME in the last 168 hours. Cardiac Enzymes: No results for input(s): CKTOTAL, CKMB, CKMBINDEX, TROPONINI in the last 168 hours. BNP (last 3 results) No results for input(s): PROBNP in the last 8760 hours. HbA1C: Recent Labs    01/23/20 0345  HGBA1C 7.0*   CBG: Recent Labs  Lab 01/22/20 1139 01/22/20 1643 01/22/20 2341 01/23/20 0508 01/23/20 0800  GLUCAP 193* 168* 167* 153* 163*   Lipid Profile: Recent Labs    02/06/2020 0913  TRIG 89   Thyroid Function Tests: No results for input(s): TSH, T4TOTAL, FREET4, T3FREE, THYROIDAB in the last 72 hours. Anemia Panel: Recent Labs    02/15/2020 0913 01/22/20 0205  FERRITIN 163 262   Urine analysis: No results found for: COLORURINE, APPEARANCEUR, LABSPEC, PHURINE, GLUCOSEU, HGBUR, BILIRUBINUR, KETONESUR, PROTEINUR, UROBILINOGEN, NITRITE,  LEUKOCYTESUR Recent Results (from the past 240 hour(s))  Blood Culture (routine x 2)     Status: None (Preliminary result)   Collection Time: 01/24/2020  9:13 AM   Specimen: Left Antecubital; Blood  Result Value Ref Range Status   Specimen Description   Final    LEFT ANTECUBITAL Performed at [facility], [address] [place], [place]    Special Requests   Final    BOTTLES DRAWN AEROBIC AND ANAEROBIC Blood Culture adequate volume Performed at [facility], [address] [place], [place]    Culture   Final    NO GROWTH < 24 HOURS Performed at [facility], [address] [place], [place]    Report Status PENDING  Incomplete  SARS Coronavirus 2 Ag (30 min TAT) -     Status: Abnormal   Collection Time: 02/14/2020  9:13 AM  Result Value Ref Range Status  SARS Coronavirus 2 Ag POSITIVE (A) NEGATIVE Final    Comment: RESULT CALLED TO, READ BACK BY AND VERIFIED WITH: MARVA SIMMS, RN @ 0867 02/07/2020 BY O COLEMAN (NOTE) SARS-CoV-2 antigen PRESENT. Positive results indicate the presence of viral antigens, but clinical correlation with patient history and other diagnostic information is necessary to determine patient infection status.  Positive results do not rule out bacterial infection or co-infection  with other viruses. False positive results are rare but can occur, and confirmatory RT-PCR testing may be appropriate in some circumstances. The expected result is Negative. Fact Sheet for Patients: PodPark.tn Fact Sheet for Providers: GiftContent.is  This test is not yet approved or cleared by the [place] FDA and  has been authorized for detection and/or diagnosis of SARS-CoV-2 by FDA under an Emergency Use Authorization (EUA).  This EUA will remain in effect (meaning this test can be used) for the duration of  the COVID-19  declaration under Section 564(b)(1) of  the Act, 21 U.S.C. section 360bbb-3(b)(1), unless the authorization is terminated or revoked sooner. Performed at [facility], [address] [place], [place] [address]   Blood Culture (routine x 2)     Status: None (Preliminary result)   Collection Time: 01/27/2020 10:30 AM   Specimen: BLOOD RIGHT HAND  Result Value Ref Range Status   Specimen Description   Final    BLOOD RIGHT HAND Performed at [facility], [address] [place], [place] [address]    Special Requests   Final    BOTTLES DRAWN AEROBIC AND ANAEROBIC Blood Culture results may not be optimal due to an inadequate volume of blood received in culture bottles Performed at [facility], [address] [place], [place] [address]    Culture   Final    NO GROWTH < 24 HOURS Performed at [facility], [place] [address], [place], [place] [address]    Report Status PENDING  Incomplete      Radiology Studies: No results found.  Scheduled Meds: . vitamin C  500 mg Oral Daily  . atenolol  25 mg Oral Daily  . azithromycin  500 mg Oral Daily  . citalopram  20 mg Oral QHS  . enoxaparin (LOVENOX) injection  100 mg Subcutaneous Q12H  . insulin aspart  0-15 Units Subcutaneous Q4H  . linagliptin  5 mg Oral Daily  . methylPREDNISolone (SOLU-MEDROL) injection  70 mg Intravenous Q8H  . pantoprazole  40 mg Oral Daily  . pravastatin  20 mg Oral q1800  . zinc sulfate  220 mg Oral Daily   Continuous Infusions: . cefTRIAXone (ROCEPHIN)  IV 2 g (01/23/20 1010)  . remdesivir 100 mg in NS 100 mL 100 mg (01/23/20 0913)     LOS: 2 days   Time spent: 35 minutes.  Patrecia Pour, MD [facility] [email or web address] 01/23/2020, 10:26 AM

## 2020-01-23 NOTE — Progress Notes (Signed)
Dr Dwana Curd aware of pts resp status, increased RR and decrease in sats, needing HHF, ok to start heated high flow and added lasix x 1dose to MAR. Pt to be transferred to room 170, pt aware and agreeable. Teaching r/t oxygen reserve, need for respiratory preparedness and interventions, ie: spirometer and flutter valve for help with resp status amd improvement.

## 2020-01-24 ENCOUNTER — Inpatient Hospital Stay (HOSPITAL_COMMUNITY): Payer: Medicare HMO

## 2020-01-24 LAB — COMPREHENSIVE METABOLIC PANEL
ALT: 41 U/L (ref 0–44)
AST: 50 U/L — ABNORMAL HIGH (ref 15–41)
Albumin: 3.4 g/dL — ABNORMAL LOW (ref 3.5–5.0)
Alkaline Phosphatase: 67 U/L (ref 38–126)
Anion gap: 13 (ref 5–15)
BUN: 42 mg/dL — ABNORMAL HIGH (ref 6–20)
CO2: 29 mmol/L (ref 22–32)
Calcium: 9.6 mg/dL (ref 8.9–10.3)
Chloride: 98 mmol/L (ref 98–111)
Creatinine, Ser: 1.33 mg/dL — ABNORMAL HIGH (ref 0.61–1.24)
GFR calc Af Amer: >60 mL/min (ref >60)
GFR calc non Af Amer: >60 mL/min (ref >60)
Glucose, Bld: 158 mg/dL — ABNORMAL HIGH (ref 70–99)
Potassium: 3.7 mmol/L (ref 3.5–5.1)
Sodium: 140 mmol/L (ref 135–145)
Total Bilirubin: 0.6 mg/dL (ref 0.3–1.2)
Total Protein: 8.7 g/dL — ABNORMAL HIGH (ref 6.5–8.1)

## 2020-01-24 LAB — CBC WITH DIFFERENTIAL/PLATELET
Abs Immature Granulocytes: 0.44 10*3/uL — ABNORMAL HIGH (ref 0.00–0.07)
Basophils Absolute: 0.1 10*3/uL (ref 0.0–0.1)
Basophils Relative: 0 %
Eosinophils Absolute: 0 10*3/uL (ref 0.0–0.5)
Eosinophils Relative: 0 %
HCT: 45.4 % (ref 39.0–52.0)
Hemoglobin: 14 g/dL (ref 13.0–17.0)
Immature Granulocytes: 2 %
Lymphocytes Relative: 7 %
Lymphs Abs: 1.6 10*3/uL (ref 0.7–4.0)
MCH: 29.1 pg (ref 26.0–34.0)
MCHC: 30.8 g/dL (ref 30.0–36.0)
MCV: 94.4 fL (ref 80.0–100.0)
Monocytes Absolute: 1.8 10*3/uL — ABNORMAL HIGH (ref 0.1–1.0)
Monocytes Relative: 8 %
Neutro Abs: 19.8 10*3/uL — ABNORMAL HIGH (ref 1.7–7.7)
Neutrophils Relative %: 83 %
Platelets: 389 10*3/uL (ref 150–400)
RBC: 4.81 MIL/uL (ref 4.22–5.81)
RDW: 13.6 % (ref 11.5–15.5)
WBC: 23.6 10*3/uL — ABNORMAL HIGH (ref 4.0–10.5)
nRBC: 0.1 % (ref 0.0–0.2)

## 2020-01-24 LAB — POCT I-STAT 7, (LYTES, BLD GAS, ICA,H+H)
Acid-Base Excess: 5 mmol/L — ABNORMAL HIGH (ref 0.0–2.0)
Acid-Base Excess: 6 mmol/L — ABNORMAL HIGH (ref 0.0–2.0)
Acid-Base Excess: 5 mmol/L — ABNORMAL HIGH (ref 0.0–2.0)
Acid-Base Excess: 6 mmol/L — ABNORMAL HIGH (ref 0.0–2.0)
Bicarbonate: 30.3 mmol/L — ABNORMAL HIGH (ref 20.0–28.0)
Bicarbonate: 30.3 mmol/L — ABNORMAL HIGH (ref 20.0–28.0)
Bicarbonate: 33.1 mmol/L — ABNORMAL HIGH (ref 20.0–28.0)
Bicarbonate: 34.2 mmol/L — ABNORMAL HIGH (ref 20.0–28.0)
Calcium, Ion: 1.21 mmol/L (ref 1.15–1.40)
Calcium, Ion: 1.23 mmol/L (ref 1.15–1.40)
Calcium, Ion: 1.27 mmol/L (ref 1.15–1.40)
Calcium, Ion: 1.24 mmol/L (ref 1.15–1.40)
HCT: 46 % (ref 39.0–52.0)
HCT: 46 % (ref 39.0–52.0)
HCT: 45 % (ref 39.0–52.0)
HCT: 41 % (ref 39.0–52.0)
Hemoglobin: 15.6 g/dL (ref 13.0–17.0)
Hemoglobin: 15.6 g/dL (ref 13.0–17.0)
Hemoglobin: 15.3 g/dL (ref 13.0–17.0)
Hemoglobin: 13.9 g/dL (ref 13.0–17.0)
O2 Saturation: 76 %
O2 Saturation: 80 %
O2 Saturation: 80 %
O2 Saturation: 80 %
Patient temperature: 97.6
Potassium: 3.5 mmol/L (ref 3.5–5.1)
Potassium: 3.6 mmol/L (ref 3.5–5.1)
Potassium: 3.9 mmol/L (ref 3.5–5.1)
Potassium: 4.3 mmol/L (ref 3.5–5.1)
Sodium: 139 mmol/L (ref 135–145)
Sodium: 139 mmol/L (ref 135–145)
Sodium: 139 mmol/L (ref 135–145)
Sodium: 139 mmol/L (ref 135–145)
TCO2: 31 mmol/L (ref 22–32)
TCO2: 32 mmol/L (ref 22–32)
TCO2: 35 mmol/L — ABNORMAL HIGH (ref 22–32)
TCO2: 36 mmol/L — ABNORMAL HIGH (ref 22–32)
pCO2 arterial: 40.2 mmHg (ref 32.0–48.0)
pCO2 arterial: 44.3 mmHg (ref 32.0–48.0)
pCO2 arterial: 63.3 mmHg — ABNORMAL HIGH (ref 32.0–48.0)
pCO2 arterial: 63 mmHg — ABNORMAL HIGH (ref 32.0–48.0)
pH, Arterial: 7.443 (ref 7.350–7.450)
pH, Arterial: 7.485 — ABNORMAL HIGH (ref 7.350–7.450)
pH, Arterial: 7.327 — ABNORMAL LOW (ref 7.350–7.450)
pH, Arterial: 7.34 — ABNORMAL LOW (ref 7.350–7.450)
pO2, Arterial: 39 mmHg — CL (ref 83.0–108.0)
pO2, Arterial: 41 mmHg — ABNORMAL LOW (ref 83.0–108.0)
pO2, Arterial: 50 mmHg — ABNORMAL LOW (ref 83.0–108.0)
pO2, Arterial: 47 mmHg — ABNORMAL LOW (ref 83.0–108.0)

## 2020-01-24 LAB — POCT I-STAT EG7
Acid-Base Excess: 6 mmol/L — ABNORMAL HIGH (ref 0.0–2.0)
Bicarbonate: 34 mmol/L — ABNORMAL HIGH (ref 20.0–28.0)
Calcium, Ion: 1.23 mmol/L (ref 1.15–1.40)
HCT: 41 % (ref 39.0–52.0)
Hemoglobin: 13.9 g/dL (ref 13.0–17.0)
O2 Saturation: 73 %
Patient temperature: 97.6
Potassium: 4.3 mmol/L (ref 3.5–5.1)
Sodium: 138 mmol/L (ref 135–145)
TCO2: 36 mmol/L — ABNORMAL HIGH (ref 22–32)
pCO2, Ven: 59.4 mmHg (ref 44.0–60.0)
pH, Ven: 7.363 (ref 7.250–7.430)
pO2, Ven: 40 mmHg (ref 32.0–45.0)

## 2020-01-24 LAB — GLUCOSE, CAPILLARY
Glucose-Capillary: 163 mg/dL — ABNORMAL HIGH (ref 70–99)
Glucose-Capillary: 151 mg/dL — ABNORMAL HIGH (ref 70–99)
Glucose-Capillary: 152 mg/dL — ABNORMAL HIGH (ref 70–99)
Glucose-Capillary: 167 mg/dL — ABNORMAL HIGH (ref 70–99)
Glucose-Capillary: 196 mg/dL — ABNORMAL HIGH (ref 70–99)
Glucose-Capillary: 192 mg/dL — ABNORMAL HIGH (ref 70–99)

## 2020-01-24 LAB — TRIGLYCERIDES: Triglycerides: 207 mg/dL — ABNORMAL HIGH (ref <150)

## 2020-01-24 LAB — C-REACTIVE PROTEIN: CRP: 8.1 mg/dL — ABNORMAL HIGH (ref <1.0)

## 2020-01-24 MED ORDER — FENTANYL 2500MCG IN NS 250ML (10MCG/ML) PREMIX INFUSION
50.0000 ug/h | INTRAVENOUS | Status: DC
  Administered 2020-01-24: 16:00:00 50 ug/h via INTRAVENOUS
  Administered 2020-01-25: 200 ug/h via INTRAVENOUS
  Filled 2020-01-24 (×2): qty 250

## 2020-01-24 MED ORDER — MIDAZOLAM BOLUS VIA INFUSION
1.0000 mg | INTRAVENOUS | Status: DC | PRN
  Filled 2020-01-24: qty 2

## 2020-01-24 MED ORDER — LABETALOL HCL 5 MG/ML IV SOLN: 10.0000 mg | INTRAVENOUS | Status: DC | PRN

## 2020-01-24 MED ORDER — FENTANYL CITRATE (PF) 100 MCG/2ML IJ SOLN
INTRAMUSCULAR | Status: AC
  Administered 2020-01-24: 17:00:00 50 ug via INTRAVENOUS
  Filled 2020-01-24: qty 4

## 2020-01-24 MED ORDER — PROPOFOL 1000 MG/100ML IV EMUL
5.0000 ug/kg/min | INTRAVENOUS | Status: DC
  Administered 2020-01-24: 17:00:00 5 ug/kg/min via INTRAVENOUS
  Administered 2020-01-24: 21:00:00 40 ug/kg/min via INTRAVENOUS
  Administered 2020-01-25: 16:00:00 25 ug/kg/min via INTRAVENOUS
  Administered 2020-01-25: 23:00:00 30 ug/kg/min via INTRAVENOUS
  Administered 2020-01-25: 10:00:00 10 ug/kg/min via INTRAVENOUS
  Administered 2020-01-25: 19:00:00 25 ug/kg/min via INTRAVENOUS
  Administered 2020-01-26 (×9): 30 ug/kg/min via INTRAVENOUS
  Administered 2020-01-27: 10:00:00 35 ug/kg/min via INTRAVENOUS
  Administered 2020-01-27 (×3): 30 ug/kg/min via INTRAVENOUS
  Filled 2020-01-24 (×20): qty 100

## 2020-01-24 MED ORDER — CHLORHEXIDINE GLUCONATE 0.12% ORAL RINSE (MEDLINE KIT)
15.0000 mL | Freq: Two times a day (BID) | OROMUCOSAL | Status: DC
  Administered 2020-01-24 – 2020-02-01 (×16): 15 mL via OROMUCOSAL

## 2020-01-24 MED ORDER — LORAZEPAM 1 MG PO TABS
1.0000 mg | ORAL_TABLET | Freq: Four times a day (QID) | ORAL | Status: DC | PRN
  Administered 2020-01-24 (×2): 1 mg via ORAL
  Filled 2020-01-24 (×2): qty 1

## 2020-01-24 MED ORDER — DEXMEDETOMIDINE HCL IN NACL 400 MCG/100ML IV SOLN
0.4000 ug/kg/h | INTRAVENOUS | Status: DC
  Administered 2020-01-24: 0.4 ug/kg/h via INTRAVENOUS
  Administered 2020-01-25: 18:00:00 0.7 ug/kg/h via INTRAVENOUS
  Administered 2020-01-25 (×4): 0.8 ug/kg/h via INTRAVENOUS
  Administered 2020-01-25: 01:00:00 0.6 ug/kg/h via INTRAVENOUS
  Administered 2020-01-25 (×3): 1.2 ug/kg/h via INTRAVENOUS
  Administered 2020-01-26 (×2): 0.8 ug/kg/h via INTRAVENOUS
  Administered 2020-01-26: 13:00:00 0.5 ug/kg/h via INTRAVENOUS
  Administered 2020-01-26 (×2): 0.8 ug/kg/h via INTRAVENOUS
  Filled 2020-01-24 (×15): qty 100

## 2020-01-24 MED ORDER — FENTANYL BOLUS VIA INFUSION
50.0000 ug | INTRAVENOUS | Status: DC | PRN
  Filled 2020-01-24: qty 50

## 2020-01-24 MED ORDER — ETOMIDATE 2 MG/ML IV SOLN
INTRAVENOUS | Status: AC
  Administered 2020-01-24: 18:00:00 20 mg
  Filled 2020-01-24: qty 10

## 2020-01-24 MED ORDER — MIDAZOLAM HCL 2 MG/2ML IJ SOLN
INTRAMUSCULAR | Status: AC
  Administered 2020-01-24: 17:00:00 2 mg via INTRAVENOUS
  Filled 2020-01-24: qty 4

## 2020-01-24 MED ORDER — ETOMIDATE 2 MG/ML IV SOLN
INTRAVENOUS | Status: AC
  Administered 2020-01-24: 16:00:00 20 mg
  Filled 2020-01-24: qty 10

## 2020-01-24 MED ORDER — MIDAZOLAM HCL 2 MG/2ML IJ SOLN
2.0000 mg | INTRAMUSCULAR | Status: DC | PRN
  Filled 2020-01-24: qty 2

## 2020-01-24 MED ORDER — ROCURONIUM BROMIDE 10 MG/ML (PF) SYRINGE
PREFILLED_SYRINGE | INTRAVENOUS | Status: AC
  Administered 2020-01-24: 18:00:00 100 mg
  Filled 2020-01-24: qty 10

## 2020-01-24 MED ORDER — ROCURONIUM BROMIDE 10 MG/ML (PF) SYRINGE
PREFILLED_SYRINGE | INTRAVENOUS | Status: AC
  Administered 2020-01-24: 16:00:00 100 mg
  Filled 2020-01-24: qty 10

## 2020-01-24 MED ORDER — MIDAZOLAM 50MG/50ML (1MG/ML) PREMIX INFUSION
0.0000 mg/h | INTRAVENOUS | Status: DC
  Administered 2020-01-24: 17:00:00 2 mg/h via INTRAVENOUS
  Administered 2020-01-24 – 2020-01-25 (×3): 10 mg/h via INTRAVENOUS
  Filled 2020-01-24: qty 200
  Filled 2020-01-24 (×4): qty 50

## 2020-01-24 MED ORDER — ORAL CARE MOUTH RINSE
15.0000 mL | OROMUCOSAL | Status: DC
  Administered 2020-01-24 – 2020-02-01 (×78): 15 mL via OROMUCOSAL

## 2020-01-24 MED ORDER — MIDAZOLAM HCL 2 MG/2ML IJ SOLN: 2.0000 mg | INTRAMUSCULAR | Status: DC | PRN

## 2020-01-24 MED ORDER — FENTANYL CITRATE (PF) 100 MCG/2ML IJ SOLN: 50.0000 ug | Freq: Once | INTRAMUSCULAR | Status: AC

## 2020-01-24 NOTE — Progress Notes (Signed)
Called patient's sister, Cordelia Pen and gave update and answered any questions she had.  Made sure she hqas my number to get in touch with me in case she thinks of any questions today.

## 2020-01-24 NOTE — Procedures (Signed)
Intubation Procedure Note Gregory Hampton [id number] [birth year]  Procedure: Intubation Indications: Respiratory insufficiency  Procedure Details Consent: Risks of procedure as well as the alternatives and risks of each were explained to the (patient/caregiver).  Consent for procedure obtained. Time Out: Verified patient identification, verified procedure, site/side was marked, verified correct patient position, special equipment/implants available, medications/allergies/relevent history reviewed, required imaging and test results available.  Performed  Maximum sterile technique was used including cap, gloves, gown, hand hygiene and mask.  Miller and 3   Grade 3 view, 1 attempt. Sats briefly to 40%   Evaluation Hemodynamic Status: BP stable throughout; O2 sats: transiently fell during during procedure Patient's Current Condition: stable Complications: No apparent complications Patient did tolerate procedure well. Chest X-ray ordered to verify placement.  CXR: pending.  [name] MD [facility] Please see [email or web address] for pager details.  01/24/2020, 4:10 PM

## 2020-01-24 NOTE — Procedures (Signed)
Central Venous Catheter Insertion Procedure Note Gregory Hampton [id number] 1966-06-14  Procedure: Insertion of Central Venous Catheter Indications: Assessment of intravascular volume and Drug and/or fluid administration  Procedure Details Consent: Risks of procedure as well as the alternatives and risks of each were explained to the (patient/caregiver).  Consent for procedure obtained. Time Out: Verified patient identification, verified procedure, site/side was marked, verified correct patient position, special equipment/implants available, medications/allergies/relevent history reviewed, required imaging and test results available.  Performed  Maximum sterile technique was used including antiseptics, cap, gloves, gown, hand hygiene, mask and sheet. Skin prep: Chlorhexidine; local anesthetic administered A antimicrobial bonded/coated triple lumen catheter was placed in the left internal jugular vein using the Seldinger technique.  Evaluation Blood flow good Complications: No apparent complications Patient did tolerate procedure well. Chest X-ray ordered to verify placement.  CXR: pending.  Gregory Greathouse MD [facility] Please see [email or web address] for pager details.  01/24/2020, 6:18 PM

## 2020-01-24 NOTE — Progress Notes (Addendum)
   NAME:  Gregory Hampton, MRN:  [id number], DOB:  1966-05-08, LOS: 3 ADMISSION DATE:  February 19, 2020, CONSULTATION DATE: 01/23/2020 REFERRING MD: Hazeline Junker, MD, CHIEF COMPLAINT: COVID-19 pneumonia  Brief History   54 year old with diabetes, obesity, hypertension, hyperlipidemia admitted on 2/2 with severe COVID-19 pneumonia Treated with steroids, remdesivir, Tocilizumab and convalescent plasma.  PCCM consulted on 2/4 for worsening oxygen requirement  Past Medical History    has a past medical history of Diabetes mellitus without complication (HCC), High cholesterol, Hypertension, and Obesity.  Significant Hospital Events   2/2 Admit 2/5 Intubated  Consults:  PCCM  Procedures:  OETT 2/5 >>  Significant Diagnostic Tests:  Chest x-ray 2/2-multifocal airspace opacities  Micro Data:    Antimicrobials/COVID RX  Convalescent plasma 2/2 Tocilizumab 2/2  Azithromycin 2/2 >> Ceftriaxone 2/2 >>  Solu-Medrol 2/2 >> Remdesivir 2/2 >>  Interim history/subjective:  Has worsening hypoxia with intermittent confusion Working hard to breath > transfer to ICU and intubated  Objective   Blood pressure (!) 159/100, pulse (!) 105, temperature (!) 97.5 F (36.4 C), temperature source Axillary, resp. rate (!) 24, height 5\' 10"  (1.778 m), weight (!) 203.2 kg, SpO2 (!) 86 %.    FiO2 (%):  [100 %] 100 %   Intake/Output Summary (Last 24 hours) at 01/24/2020 1613 Last data filed at 01/24/2020 1200 Gross per 24 hour  Intake 1033.4 ml  Output 3550 ml  Net -2516.6 ml   Filed Weights   February 19, 2020 0906 02-19-20 0910  Weight: (!) 202.8 kg (!) 203.2 kg    Examination: Blood pressure (!) 159/100, pulse (!) 105, temperature (!) 97.5 F (36.4 C), temperature source Axillary, resp. rate (!) 24, height 5\' 10"  (1.778 m), weight (!) 203.2 kg, SpO2 (!) 86 %. Gen:      No acute distress, obese HEENT:  EOMI, sclera anicteric Neck:     No masses; no thyromegaly, ETT Lungs:    Clear to auscultation bilaterally;  normal respiratory effort CV:         Regular rate and rhythm; no murmurs Abd:      + bowel sounds; soft, non-tender; no palpable masses, no distension Ext:    No edema; adequate peripheral perfusion Skin:      Warm and dry; no rash Neuro: Sedated  Resolved Hospital Problem list     Assessment & Plan:  Severe COVID-19 pneumonia Received full treatment options including steroids, remdesivir, plasma and Tocilizumab but progressed to intubation ARDS net protocol Follow CXR, ABG and determine if he needs proning Continue steroids, remdesivir IV lasix as tolerated by renal function  DM SSI coverage Trajenda  HTN Continue atenolol Labetelol PRN  Best practice:  Diet: N.p.o. Pain/Anxiety/Delirium protocol (if indicated): Fentanyl, versed PRN RASS goal -2,3 VAP protocol (if indicated): NA DVT prophylaxis: Lovenox GI prophylaxis: Protonix Glucose control: SSI, Tradjenta Mobility: Bed Code Status: Full Family Communication: Per primary team Disposition: ICU   Critical care time:    The patient is critically ill with multiple organ system failure and requires high complexity decision making for assessment and support, frequent evaluation and titration of therapies, advanced monitoring, review of radiographic studies and interpretation of complex data.   Critical Care Time devoted to patient care services, exclusive of separately billable procedures, described in this note is 35 minutes.   03/20/20 MD [facility] Please see [email or web address] for pager details.  01/24/2020, 4:19 PM

## 2020-01-24 NOTE — Progress Notes (Signed)
At 1445  I saw on monitor pt oxygen had dropped to 50 went in to evaluate him he was confuses combative and pulled off all oxygen and pulling off all monitors calmed pt down applied oxygen back pt SATs never could recover pt moved to ICU with Res Dr.Mannam to bedside pt intubated, and orders placed patient stable after increase sedation and line place by doctor will continue to monitor

## 2020-01-24 NOTE — Progress Notes (Signed)
eLink Physician-Brief Progress Note Patient Name: Gregory Hampton DOB: 1966-05-14 MRN: [id number]   Date of Service  01/24/2020  HPI/Events of Note  Notified of ABG result 7.34/63/47 with increased work of breathing now off propofol due to elevated TG. Still on Versed and Fentanyl  eICU Interventions  Will give a trial of Precedex and possibly neuromuscular blocker once adequately sedated. RASS -4. Bedside team states unable to prone as they lack staffing and on their assessment patient too unstable.     Intervention Category Major Interventions: Hypoxemia - evaluation and management  Darl Pikes 01/24/2020, 10:20 PM

## 2020-01-24 NOTE — Progress Notes (Signed)
Pt taking O2 off and spo2 dropped to 50%. Pt seems confused but once O2 reapplied pt improved to 80% and began to not be as confused. RN at bedside. RT will continue to closely monitor

## 2020-01-24 NOTE — Progress Notes (Signed)
PROGRESS NOTE  Gregory Hampton  HYW:[id number] DOB: March 07, 1966 DOA: 08-Feb-2020 PCP: Quitman Livings, MD   Brief Narrative: Gregory Hampton is a 54 y.o. male with a history of T2DM, HTN, obesity, HLD who developed shortness of breath 1/25, started on antibiotics and steroids by PCP and noted to have hypoxemia at home for which he used his mother's home oxygen until symptoms worsened on 2/2 for which he presented to the ED.He was found to be hypoxic requiring 15L NRB with positive SARS-CoV-2 antigen. CRP 26, bilateral CXR infiltrates. Remdesivir and steroids started, tocilizumab and convalescent plasma ordered 2/2 and he was admitted to [facility].  Assessment & Plan: Active Problems:   Pneumonia due to COVID-19 virus   Diabetes mellitus without complication (HCC)   Acute respiratory failure with hypoxia (HCC)   Hypertension   Type 2 diabetes mellitus with hyperlipidemia (HCC)   Morbid obesity with BMI of 60.0-69.9, adult (HCC)  Acute hypoxemic respiratory failure due to covid-19 pneumonia: SARS-CoV-2 Ag positive on 2/2. Symptoms 1/29, so at high risk of continued decompensation. - Continues HHF requirement. PCCM consult 2/4 appreciated, will need continued close monitoring, remains at high risk for intubation. Will give anxiolytic due to significant anxiety.  - Continue remdesivir x5 days 2/2 - 2/6.  - Steroids x10 days 2/2 - 2/11 - Continue empiric abx due to elevated PCT, having received tocilizumab 2/2. Hep B SAg NR. CRP continues improvement 29 > 15 > 8.  - Convalescent plasma ordered - Vitamin C, zinc - Encourage OOB, IS, FV, and awake proning if able - Tylenol and antitussives prn - Continue airborne, contact precautions while admitted. Isolation period would be recommended for 21 days from positive testing. - Check CBC w/diff, CMP, CRP daily - Continue IV lasix again today. 2.8L UOP yesterday. Creatinine slightly up though CrCl remains >170ml/min. Monitor I/O, daily weights.   T2DM: HbA1c  7% - Continue SSI, remains at inpatient goal.   HTN:  - Continue atenolol. Holding lisinopril, HCTZ  Hyperlipidemia:  - Continue statin  Depression, anxiety:  - Continue SSRI, trial low dose po lorazepam due to situational anxiety.  Morbid obesity: BMI 64. Noted.   Chronic back pain:  - Will reorder home dose hydrocodone.   DVT prophylaxis: Lovenox 0.5mg /kg q12h intermediate dose due to increased risk of VTE.  Code Status: Full Family Communication: Sister by phone during encounter Disposition Plan: From home, likely to return home pending improvement in hypoxemic respiratory failure. Remains critically ill.  Consultants:   PCCM  Procedures:   None  Antimicrobials:  Remdesivir, ceftriaxone, azithromycin 2/2 - 2/6   Subjective: States he feels better today, though shortness of breath is still severe, constant, worse with exertion, alleviated by HHF O2. No chest pain. Has made a lot of urine.  Objective: Vitals:   01/24/20 0808 01/24/20 0848 01/24/20 0900 01/24/20 1000  BP: 121/81  (!) 140/103   Pulse: 100 (!) 104 (!) 106 (!) 108  Resp: 20 (!) 28 (!) 23 (!) 34  Temp: 97.8 F (36.6 C)     TempSrc: Axillary     SpO2: 92% (!) 87% (!) 84% (!) 88%  Weight:      Height:        Intake/Output Summary (Last 24 hours) at 01/24/2020 1052 Last data filed at 01/24/2020 0910 Gross per 24 hour  Intake 1857 ml  Output 2250 ml  Net -393 ml   Filed Weights   02-08-2020 0906 2020/02/08 0910  Weight: (!) 202.8 kg (!) 203.2 kg  Gen: 53 y.o. male in no distress Pulm: Mildly labored tachypnea, no crackles, diminished. CV: Regular tachycardia. No murmur, rub, or gallop. No JVD, no dependent edema. GI: Abdomen soft, non-tender, non-distended, with normoactive bowel sounds.  Ext: Warm, no deformities Skin: No new rashes, lesions or ulcers on visualized skin. Neuro: Alert and oriented. No focal neurological deficits. Psych: Judgement and insight appear fair. Mood euthymic & affect  congruent. Behavior is appropriate.    Data Reviewed: I have personally reviewed following labs and imaging studies  CBC: Recent Labs  Lab 02/08/2020 0913 01/22/20 0205 01/23/20 0345 01/24/20 0145  WBC 13.9* 15.4* 21.8* 23.6*  NEUTROABS 12.0* 13.8* 19.0* 19.8*  HGB 12.2* 13.4 13.7 14.0  HCT 39.2 40.8 43.4 45.4  MCV 95.4 95.3 93.9 94.4  PLT 268 296 351 389   Basic Metabolic Panel: Recent Labs  Lab Feb 08, 2020 0913 01/22/20 0205 01/23/20 0345 01/24/20 0145  NA 133* 138 139 140  K 3.7 4.4 3.9 3.7  CL 94* 99 96* 98  CO2 27 28 28 29   GLUCOSE 154* 163* 155* 158*  BUN 15 23* 38* 42*  CREATININE 1.26* 1.11 1.16 1.33*  CALCIUM 9.6 9.5 9.7 9.6  MG  --  2.3  --   --   PHOS  --  4.8*  --   --    GFR: Estimated Creatinine Clearance: 112.3 mL/min (A) (by C-G formula based on SCr of 1.33 mg/dL (H)). Liver Function Tests: Recent Labs  Lab 02/08/20 0913 01/22/20 0205 01/23/20 0345 01/24/20 0145  AST 54* 54* 48* 50*  ALT 33 35 39 41  ALKPHOS 53 58 59 67  BILITOT 0.7 0.3 0.8 0.6  PROT 8.8* 9.1* 9.0* 8.7*  ALBUMIN 3.6 3.5 3.6 3.4*   No results for input(s): LIPASE, AMYLASE in the last 168 hours. No results for input(s): AMMONIA in the last 168 hours. Coagulation Profile: No results for input(s): INR, PROTIME in the last 168 hours. Cardiac Enzymes: No results for input(s): CKTOTAL, CKMB, CKMBINDEX, TROPONINI in the last 168 hours. BNP (last 3 results) No results for input(s): PROBNP in the last 8760 hours. HbA1C: Recent Labs    01/23/20 0345  HGBA1C 7.0*   CBG: Recent Labs  Lab 01/23/20 1614 01/23/20 1931 01/24/20 0107 01/24/20 0537 01/24/20 0722  GLUCAP 186* 193* 163* 151* 152*   Lipid Profile: No results for input(s): CHOL, HDL, LDLCALC, TRIG, CHOLHDL, LDLDIRECT in the last 72 hours. Thyroid Function Tests: No results for input(s): TSH, T4TOTAL, FREET4, T3FREE, THYROIDAB in the last 72 hours. Anemia Panel: Recent Labs    01/22/20 0205  FERRITIN 262    Urine analysis: No results found for: COLORURINE, APPEARANCEUR, LABSPEC, PHURINE, GLUCOSEU, HGBUR, BILIRUBINUR, KETONESUR, PROTEINUR, UROBILINOGEN, NITRITE, LEUKOCYTESUR Recent Results (from the past 240 hour(s))  Blood Culture (routine x 2)     Status: None (Preliminary result)   Collection Time: Feb 08, 2020  9:13 AM   Specimen: Left Antecubital; Blood  Result Value Ref Range Status   Specimen Description   Final    LEFT ANTECUBITAL Performed at [facility], [address] [place], [place]    Special Requests   Final    BOTTLES DRAWN AEROBIC AND ANAEROBIC Blood Culture adequate volume Performed at [facility], [address] [place], [place]    Culture   Final    NO GROWTH 2 DAYS Performed at [facility], [address] [place], [place]    Report Status PENDING  Incomplete  SARS Coronavirus 2 Ag (30 min TAT) -     Status: Abnormal   Collection Time: 01/30/20  9:13 AM  Result Value Ref Range Status   SARS Coronavirus 2 Ag POSITIVE (A) NEGATIVE Final    Comment: RESULT CALLED TO, READ BACK BY AND VERIFIED WITH: MARVA SIMMS, RN @ 1007 2020/01/30 BY O COLEMAN (NOTE) SARS-CoV-2 antigen PRESENT. Positive results indicate the presence of viral antigens, but clinical correlation with patient history and other diagnostic information is necessary to determine patient infection status.  Positive results do not rule out bacterial infection or co-infection  with other viruses. False positive results are rare but can occur, and confirmatory RT-PCR testing may be appropriate in some circumstances. The expected result is Negative. Fact Sheet for Patients: https://sanders-williams.net/ Fact Sheet for Providers: https://martinez.com/  This test is not yet approved or cleared by the [place] FDA and  has been authorized for detection and/or diagnosis of SARS-CoV-2 by FDA under an Emergency Use  Authorization (EUA).  This EUA will remain in effect (meaning this test can be used) for the duration of  the COVID-19  declaration under Section 564(b)(1) of the Act, 21 U.S.C. section 360bbb-3(b)(1), unless the authorization is terminated or revoked sooner. Performed at [facility], [address] [place], [place] [address]   Blood Culture (routine x 2)     Status: None (Preliminary result)   Collection Time: Jan 30, 2020 10:30 AM   Specimen: BLOOD RIGHT HAND  Result Value Ref Range Status   Specimen Description   Final    BLOOD RIGHT HAND Performed at [facility], [address] [place], [place] [address]    Special Requests   Final    BOTTLES DRAWN AEROBIC AND ANAEROBIC Blood Culture results may not be optimal due to an inadequate volume of blood received in culture bottles Performed at [facility], [address] [place], [place] [address]    Culture   Final    NO GROWTH 2 DAYS Performed at [facility], [address], [place], [place] [address]    Report Status PENDING  Incomplete      Radiology Studies: DG CHEST PORT 1 VIEW  Result Date: 01/23/2020 CLINICAL DATA:  Acute respiratory failure. EXAM: PORTABLE CHEST 1 VIEW COMPARISON:  2020-01-30. FINDINGS: Again limited exam due to patient's body habitus and portable technique. Dense diffuse bilateral pulmonary infiltrates are again noted. Infiltrates may have worsened from prior exam. Pleural effusions cannot be excluded. No pneumothorax. Cardiomegaly. IMPRESSION: 1. Limited exam. Dense diffuse bilateral pulmonary infiltrates are again noted. Infiltrates may have worsened from prior exams. Pleural effusions cannot be excluded. 2.  Cardiomegaly. Electronically Signed   By: Maisie Fus  Register   On: 01/23/2020 11:45    Scheduled Meds: . vitamin C  500 mg Oral Daily  . atenolol  25 mg Oral Daily  . azithromycin  500 mg Oral Daily  . Chlorhexidine Gluconate Cloth  6 each Topical Daily  .  citalopram  20 mg Oral QHS  . enoxaparin (LOVENOX) injection  100 mg Subcutaneous Q12H  . furosemide  80 mg Intravenous BID  . insulin aspart  0-15 Units Subcutaneous Q4H  . linagliptin  5 mg Oral Daily  . methylPREDNISolone (SOLU-MEDROL) injection  70 mg Intravenous Q8H  . pantoprazole  40 mg Oral Daily  . pravastatin  20 mg Oral q1800  . zinc sulfate  220 mg Oral Daily   Continuous Infusions: . cefTRIAXone (  ROCEPHIN)  IV 2 g (01/24/20 0910)  . remdesivir 100 mg in NS 100 mL 100 mg (01/23/20 0913)     LOS: 3 days   Time spent: 35 minutes.  Patrecia Pour, MD [facility] [email or web address] 01/24/2020, 10:52 AM

## 2020-01-25 LAB — CBC WITH DIFFERENTIAL/PLATELET
Abs Immature Granulocytes: 0.53 10*3/uL — ABNORMAL HIGH (ref 0.00–0.07)
Basophils Absolute: 0.1 10*3/uL (ref 0.0–0.1)
Basophils Relative: 0 %
Eosinophils Absolute: 0 10*3/uL (ref 0.0–0.5)
Eosinophils Relative: 0 %
HCT: 41.1 % (ref 39.0–52.0)
Hemoglobin: 12.4 g/dL — ABNORMAL LOW (ref 13.0–17.0)
Immature Granulocytes: 2 %
Lymphocytes Relative: 7 %
Lymphs Abs: 1.6 10*3/uL (ref 0.7–4.0)
MCH: 29.3 pg (ref 26.0–34.0)
MCHC: 30.2 g/dL (ref 30.0–36.0)
MCV: 97.2 fL (ref 80.0–100.0)
Monocytes Absolute: 1.9 10*3/uL — ABNORMAL HIGH (ref 0.1–1.0)
Monocytes Relative: 8 %
Neutro Abs: 19.3 10*3/uL — ABNORMAL HIGH (ref 1.7–7.7)
Neutrophils Relative %: 83 %
Platelets: 372 10*3/uL (ref 150–400)
RBC: 4.23 MIL/uL (ref 4.22–5.81)
RDW: 13.6 % (ref 11.5–15.5)
WBC: 23.4 10*3/uL — ABNORMAL HIGH (ref 4.0–10.5)
nRBC: 0.2 % (ref 0.0–0.2)

## 2020-01-25 LAB — COMPREHENSIVE METABOLIC PANEL
ALT: 28 U/L (ref 0–44)
AST: 24 U/L (ref 15–41)
Albumin: 2.2 g/dL — ABNORMAL LOW (ref 3.5–5.0)
Alkaline Phosphatase: 39 U/L (ref 38–126)
Anion gap: 12 (ref 5–15)
BUN: 47 mg/dL — ABNORMAL HIGH (ref 6–20)
CO2: 22 mmol/L (ref 22–32)
Calcium: 6.1 mg/dL — CL (ref 8.9–10.3)
Chloride: 107 mmol/L (ref 98–111)
Creatinine, Ser: 1.4 mg/dL — ABNORMAL HIGH (ref 0.61–1.24)
GFR calc Af Amer: >60 mL/min (ref >60)
GFR calc non Af Amer: 57 mL/min — ABNORMAL LOW (ref >60)
Glucose, Bld: 147 mg/dL — ABNORMAL HIGH (ref 70–99)
Potassium: 3.2 mmol/L — ABNORMAL LOW (ref 3.5–5.1)
Sodium: 141 mmol/L (ref 135–145)
Total Bilirubin: 0.3 mg/dL (ref 0.3–1.2)
Total Protein: 5.2 g/dL — ABNORMAL LOW (ref 6.5–8.1)

## 2020-01-25 LAB — GLUCOSE, CAPILLARY
Glucose-Capillary: 218 mg/dL — ABNORMAL HIGH (ref 70–99)
Glucose-Capillary: 183 mg/dL — ABNORMAL HIGH (ref 70–99)
Glucose-Capillary: 174 mg/dL — ABNORMAL HIGH (ref 70–99)
Glucose-Capillary: 174 mg/dL — ABNORMAL HIGH (ref 70–99)
Glucose-Capillary: 172 mg/dL — ABNORMAL HIGH (ref 70–99)
Glucose-Capillary: 177 mg/dL — ABNORMAL HIGH (ref 70–99)

## 2020-01-25 LAB — C-REACTIVE PROTEIN: CRP: 3 mg/dL — ABNORMAL HIGH (ref <1.0)

## 2020-01-25 MED ORDER — VECURONIUM BROMIDE 10 MG IV SOLR
10.0000 mg | Freq: Once | INTRAVENOUS | Status: AC
  Administered 2020-01-25: 10 mg via INTRAVENOUS

## 2020-01-25 MED ORDER — LINAGLIPTIN 5 MG PO TABS
5.0000 mg | ORAL_TABLET | Freq: Every day | ORAL | Status: DC
  Administered 2020-01-26 – 2020-02-01 (×6): 5 mg
  Filled 2020-01-25 (×6): qty 1

## 2020-01-25 MED ORDER — HYDROMORPHONE BOLUS VIA INFUSION
0.5000 mg | INTRAVENOUS | Status: DC | PRN
  Filled 2020-01-25: qty 1

## 2020-01-25 MED ORDER — PRO-STAT SUGAR FREE PO LIQD
60.0000 mL | Freq: Three times a day (TID) | ORAL | Status: DC
  Administered 2020-01-25 – 2020-02-01 (×21): 60 mL
  Filled 2020-01-25 (×21): qty 60

## 2020-01-25 MED ORDER — ASCORBIC ACID 500 MG PO TABS
500.0000 mg | ORAL_TABLET | Freq: Every day | ORAL | Status: DC
  Administered 2020-01-26 – 2020-02-01 (×7): 500 mg
  Filled 2020-01-25 (×7): qty 1

## 2020-01-25 MED ORDER — CITALOPRAM HYDROBROMIDE 20 MG PO TABS
20.0000 mg | ORAL_TABLET | Freq: Every day | ORAL | Status: DC
  Administered 2020-01-25 – 2020-01-31 (×7): 20 mg
  Filled 2020-01-25 (×9): qty 1

## 2020-01-25 MED ORDER — VECURONIUM BROMIDE 10 MG IV SOLR
INTRAVENOUS | Status: AC
  Administered 2020-01-25: 09:00:00 10 mg
  Filled 2020-01-25: qty 10

## 2020-01-25 MED ORDER — STERILE WATER FOR INJECTION IJ SOLN
INTRAMUSCULAR | Status: AC
  Filled 2020-01-25: qty 10

## 2020-01-25 MED ORDER — SODIUM CHLORIDE 0.9 % IV SOLN
1.0000 mg/h | INTRAVENOUS | Status: DC
  Administered 2020-01-25: 10:00:00 4 mg/h via INTRAVENOUS
  Administered 2020-01-25 – 2020-01-26 (×2): 7 mg/h via INTRAVENOUS
  Administered 2020-01-26: 23:00:00 4 mg/h via INTRAVENOUS
  Administered 2020-01-26: 07:00:00 7 mg/h via INTRAVENOUS
  Administered 2020-01-27: 13:00:00 4 mg/h via INTRAVENOUS
  Administered 2020-01-28: 03:00:00 2 mg/h via INTRAVENOUS
  Administered 2020-01-29 – 2020-02-01 (×7): 4 mg/h via INTRAVENOUS
  Filled 2020-01-25 (×23): qty 5

## 2020-01-25 MED ORDER — MIDAZOLAM BOLUS VIA INFUSION
1.0000 mg | INTRAVENOUS | Status: DC | PRN
  Administered 2020-01-28: 07:00:00 2 mg via INTRAVENOUS
  Filled 2020-01-25: qty 2

## 2020-01-25 MED ORDER — MIDAZOLAM 50MG/50ML (1MG/ML) PREMIX INFUSION
2.0000 mg/h | INTRAVENOUS | Status: DC
  Administered 2020-01-25 – 2020-01-27 (×2): 2 mg/h via INTRAVENOUS
  Administered 2020-01-27: 16:00:00 5 mg/h via INTRAVENOUS
  Administered 2020-01-28: 07:00:00 3 mg/h via INTRAVENOUS
  Administered 2020-01-28: 19:00:00 4 mg/h via INTRAVENOUS
  Administered 2020-01-29: 3 mg/h via INTRAVENOUS
  Administered 2020-01-29 – 2020-01-31 (×4): 4 mg/h via INTRAVENOUS
  Administered 2020-02-01 (×2): 8 mg/h via INTRAVENOUS
  Administered 2020-02-01: 04:00:00 4 mg/h via INTRAVENOUS
  Filled 2020-01-25 (×12): qty 50

## 2020-01-25 MED ORDER — VITAL 1.5 CAL PO LIQD
1000.0000 mL | ORAL | Status: DC
  Administered 2020-01-25 – 2020-01-28 (×3): 1000 mL

## 2020-01-25 MED ORDER — POTASSIUM CHLORIDE 20 MEQ/15ML (10%) PO SOLN
20.0000 meq | Freq: Two times a day (BID) | ORAL | Status: AC
  Administered 2020-01-25 (×2): 20 meq
  Filled 2020-01-25 (×2): qty 15

## 2020-01-25 MED ORDER — PRAVASTATIN SODIUM 20 MG PO TABS
20.0000 mg | ORAL_TABLET | Freq: Every day | ORAL | Status: DC
  Administered 2020-01-25 – 2020-01-28 (×4): 20 mg
  Filled 2020-01-25 (×5): qty 1

## 2020-01-25 MED ORDER — NOREPINEPHRINE 4 MG/250ML-% IV SOLN
INTRAVENOUS | Status: AC
  Filled 2020-01-25: qty 250

## 2020-01-25 MED ORDER — HYDROMORPHONE HCL 1 MG/ML IJ SOLN
1.0000 mg | Freq: Once | INTRAMUSCULAR | Status: AC
  Administered 2020-01-25: 10:00:00 1 mg via INTRAVENOUS

## 2020-01-25 MED ORDER — ZINC SULFATE 220 (50 ZN) MG PO CAPS
220.0000 mg | ORAL_CAPSULE | Freq: Every day | ORAL | Status: DC
  Administered 2020-01-26 – 2020-02-01 (×7): 220 mg
  Filled 2020-01-25 (×7): qty 1

## 2020-01-25 MED ORDER — NOREPINEPHRINE 4 MG/250ML-% IV SOLN
0.0000 ug/min | INTRAVENOUS | Status: DC
  Administered 2020-01-26: 12:00:00 2 ug/min via INTRAVENOUS
  Administered 2020-01-27 (×2): 13 ug/min via INTRAVENOUS
  Administered 2020-01-27: 22 ug/min via INTRAVENOUS
  Administered 2020-01-27: 20:00:00 13 ug/min via INTRAVENOUS
  Administered 2020-01-27: 13:00:00 11 ug/min via INTRAVENOUS
  Administered 2020-01-28: 03:00:00 26 ug/min via INTRAVENOUS
  Filled 2020-01-25 (×6): qty 250

## 2020-01-25 MED ORDER — ARTIFICIAL TEARS OPHTHALMIC OINT
1.0000 "application " | TOPICAL_OINTMENT | Freq: Three times a day (TID) | OPHTHALMIC | Status: DC
  Administered 2020-01-25 – 2020-02-01 (×23): 1 via OPHTHALMIC
  Filled 2020-01-25 (×5): qty 3.5

## 2020-01-25 MED ORDER — SODIUM CHLORIDE 0.9 % IV SOLN
0.0000 ug/kg/min | INTRAVENOUS | Status: DC
  Administered 2020-01-25 – 2020-01-28 (×12): 3 ug/kg/min via INTRAVENOUS
  Filled 2020-01-25 (×19): qty 20

## 2020-01-25 MED ORDER — PANTOPRAZOLE SODIUM 40 MG PO PACK
40.0000 mg | PACK | Freq: Every day | ORAL | Status: DC
  Administered 2020-01-26 – 2020-02-01 (×7): 40 mg
  Filled 2020-01-25 (×7): qty 20

## 2020-01-25 NOTE — Progress Notes (Signed)
75 ml of Fentanyl and 55ml of versed wasted in stericycle.  Witnessed by [place] RN

## 2020-01-25 NOTE — Progress Notes (Signed)
Pt proned at this time with no complications. ETT securely taped in proper position. Head turned to left with arm up in swimmers position with pillow. VS within normal limits. RT will continue to monitor

## 2020-01-25 NOTE — Procedures (Signed)
Arterial Catheter Insertion Procedure Note Chael Urenda [id number] 1966/04/08  Procedure: Insertion of Arterial Catheter  Indications: Blood pressure monitoring  Procedure Details Consent: Unable to obtain consent because of altered level of consciousness. Time Out: Verified patient identification, verified procedure, site/side was marked, verified correct patient position, special equipment/implants available, medications/allergies/relevent history reviewed, required imaging and test results available.  Performed  Maximum sterile technique was used including cap, gloves, gown, hand hygiene, mask and sheet. Skin prep: Chlorhexidine; local anesthetic administered 20 gauge catheter was inserted into right radial artery using the Seldinger technique. ULTRASOUND GUIDANCE USED: NO Evaluation Blood flow good; BP tracing good. Complications: No apparent complications.   Carolan Shiver 01/25/2020

## 2020-01-25 NOTE — Progress Notes (Signed)
NAME:  Gregory Hampton, MRN:  [id number], DOB:  Aug 23, 1966, LOS: 4 ADMISSION DATE:  01/20/2020, CONSULTATION DATE: 01/23/2020 REFERRING MD: Vance Gather, MD, CHIEF COMPLAINT: COVID-19 pneumonia  Brief History   54 year old with diabetes, obesity, hypertension, hyperlipidemia admitted on 2/2 with severe COVID-19 pneumonia Treated with steroids, remdesivir, Tocilizumab and convalescent plasma.  PCCM consulted on 2/4 for worsening oxygen requirement  Past Medical History    has a past medical history of Diabetes mellitus without complication ([place]), High cholesterol, Hypertension, and Obesity.  Significant Hospital Events   2/2 Admit 2/5 Intubated  Consults:  PCCM  Procedures:  OETT 2/5 >>  Significant Diagnostic Tests:  Chest x-ray 2/2-multifocal airspace opacities  Micro Data:    Antimicrobials/COVID RX  Convalescent plasma 2/2 Tocilizumab 2/2  Azithromycin 2/2 >> Ceftriaxone 2/2 >>  Solu-Medrol 2/2 >> Remdesivir 2/2 >>  Interim history/subjective:  Remains intubated.  Unable to be prone overnight due to instability Sedation requirements.  Objective   Blood pressure 96/66, pulse 86, temperature 98.1 F (36.7 C), temperature source Axillary, resp. rate (!) 36, height 5\' 10"  (1.778 m), weight (!) 203.2 kg, SpO2 98 %.    Vent Mode: PRVC FiO2 (%):  [100 %] 100 % Set Rate:  [24 bmp-30 bmp] 30 bmp Vt Set:  [430 mL] 430 mL PEEP:  [14 cmH20] 14 cmH20 Plateau Pressure:  [26 cmH20-31 cmH20] 26 cmH20   Intake/Output Summary (Last 24 hours) at 01/25/2020 0805 Last data filed at 01/25/2020 0500 Gross per 24 hour  Intake 1566.05 ml  Output 4885 ml  Net -3318.95 ml   Filed Weights   02/15/2020 0906 02/08/2020 0910  Weight: (!) 202.8 kg (!) 203.2 kg    Examination: Blood pressure 96/66, pulse 86, temperature 98.1 F (36.7 C), temperature source Axillary, resp. rate (!) 36, height 5\' 10"  (1.778 m), weight (!) 203.2 kg, SpO2 98 %. Gen:      No acute distress HEENT:  EOMI, sclera  anicteric Neck:     No masses; no thyromegaly, ETT Lungs:    Clear to auscultation bilaterally; normal respiratory effort CV:         Regular rate and rhythm; no murmurs Abd:      + bowel sounds; soft, non-tender; no palpable masses, no distension Ext:    No edema; adequate peripheral perfusion Skin:      Warm and dry; no rash Neuro: Sedated, obese  Resolved Hospital Problem list     Assessment & Plan:  Severe COVID-19 pneumonia Received full treatment options including steroids, remdesivir, plasma and Tocilizumab but progressed to intubation ARDS net protocol.  Target plateau pressure less than 30 and driving pressure less than 15 Will attempt proning today. Start nimbex for vent dyssynchrony Change fentanyl to dilaudid Continue steroids, remdesivir IV lasix as tolerated by renal function  DM SSI coverage Trajenda  HTN Continue atenolol Labetelol PRN  Best practice:  Diet: N.p.o. Pain/Anxiety/Delirium protocol (if indicated): Fentanyl, versed gtt RASS goal -2,3 VAP protocol (if indicated): NA DVT prophylaxis: Lovenox GI prophylaxis: Protonix Glucose control: SSI, Tradjenta Mobility: Bed Code Status: Full Family Communication: Pending Disposition: ICU   Critical care time:    The patient is critically ill with multiple organ system failure and requires high complexity decision making for assessment and support, frequent evaluation and titration of therapies, advanced monitoring, review of radiographic studies and interpretation of complex data.   Critical Care Time devoted to patient care services, exclusive of separately billable procedures, described in this note is 45 minutes.  [name] MD [facility] Please see [email or web address] for pager details.  01/25/2020, 8:08 AM

## 2020-01-25 NOTE — Progress Notes (Signed)
Initial Nutrition Assessment  DOCUMENTATION CODES:   Morbid obesity  INTERVENTION:   Tube feeding:  -Vital 1.5 @ 40 ml/hr via OGT (960 ml) -60 ml Prostat TID  Provides: 2040 kcals (2362 kcal with propofol), 155 grams protein, 733 ml free water.   NUTRITION DIAGNOSIS:   Increased nutrient needs related to acute illness(COVID 19 PNA) as evidenced by estimated needs.  GOAL:   Patient will meet greater than or equal to 90% of their needs  MONITOR:   Diet advancement, Vent status, Skin, TF tolerance, Weight trends, Labs, I & O's  REASON FOR ASSESSMENT:   Consult Enteral/tube feeding initiation and management  ASSESSMENT:   Patient with PMH significant for morbid obesity, DM, HTN, and HLD. Presents this admission with COVID-19 PNA.   2/5- worsened respiratory status, intubated   Patient now proned. Remains on propofol. Continues to diurese. RD to start TF. Utilize 202.8 kg as EDW.   Patient is currently intubated on ventilator support MV: 12.7 L/min Temp (24hrs), Avg:98.5 F (36.9 C), Min:97.6 F (36.4 C), Max:99.2 F (37.3 C)  Propofol: 12.2 ml/hr- provides 322 kcal from lipids daily   I/O: -5,484 ml since admit  UOP: 4,885 ml x 24 hrs   Drips: nimbex, precedex, propofol Medications: 500 mg Vitamin C daily, 80 mg lasix BID, SS novolog, tradjenta, solumedrol, 220 mg zinc sulfate Labs: K 3.2 (L) corrected calcium 7.5 (L) CBG 147-193  Diet Order:   Diet Order            Diet NPO time specified  Diet effective now              EDUCATION NEEDS:   Not appropriate for education at this time  Skin:  Skin Assessment: Skin Integrity Issues: Skin Integrity Issues:: Other (Comment) Other: MASD- abdomen, breast  Last BM:  2/5  Height:   Ht Readings from Last 1 Encounters:  13-Feb-2020 5\' 10"  (1.778 m)    Weight:   Wt Readings from Last 1 Encounters:  02-13-2020 (!) 203.2 kg    Ideal Body Weight:  75.5 kg  BMI:  Body mass index is 64.28  kg/m.  Estimated Nutritional Needs:   Kcal:  03/20/20 kcal  Protein:  151-189  grams  Fluid:  >/= 2.1 L/day   [id number] RD, LDN Clinical Nutrition Pager listed in AMION

## 2020-01-25 NOTE — Progress Notes (Signed)
eLink Physician-Brief Progress Note Patient Name: Gregory Hampton DOB: 1966-03-11 MRN: [id number]   Date of Service  01/25/2020  HPI/Events of Note  Notified of tachypnea on Precedex, Versed 10 and Fentanyl 200. Synchronized on vent. Sats 98%  eICU Interventions  Ordered to increase max of Fentanyl to 300. Patient's BMI 64     Intervention Category Intermediate Interventions: Respiratory distress - evaluation and management  Darl Pikes 01/25/2020, 4:47 AM

## 2020-01-25 NOTE — Progress Notes (Signed)
Patient's head repositioned and arms rotated.  Patient tolerated well.  No skin breakdown noted at this time.    

## 2020-01-26 ENCOUNTER — Inpatient Hospital Stay (HOSPITAL_COMMUNITY): Payer: Medicare HMO

## 2020-01-26 LAB — LACTIC ACID, PLASMA: Lactic Acid, Venous: 1.6 mmol/L (ref 0.5–1.9)

## 2020-01-26 LAB — POCT I-STAT 7, (LYTES, BLD GAS, ICA,H+H)
Acid-Base Excess: 1 mmol/L (ref 0.0–2.0)
Acid-Base Excess: 1 mmol/L (ref 0.0–2.0)
Bicarbonate: 30.2 mmol/L — ABNORMAL HIGH (ref 20.0–28.0)
Bicarbonate: 31.1 mmol/L — ABNORMAL HIGH (ref 20.0–28.0)
Calcium, Ion: 1.19 mmol/L (ref 1.15–1.40)
Calcium, Ion: 1.19 mmol/L (ref 1.15–1.40)
HCT: 41 % (ref 39.0–52.0)
HCT: 41 % (ref 39.0–52.0)
Hemoglobin: 13.9 g/dL (ref 13.0–17.0)
Hemoglobin: 13.9 g/dL (ref 13.0–17.0)
O2 Saturation: 89 %
O2 Saturation: 81 %
Patient temperature: 97.7
Patient temperature: 97.6
Potassium: 5 mmol/L (ref 3.5–5.1)
Potassium: 4.9 mmol/L (ref 3.5–5.1)
Sodium: 142 mmol/L (ref 135–145)
Sodium: 142 mmol/L (ref 135–145)
TCO2: 32 mmol/L (ref 22–32)
TCO2: 33 mmol/L — ABNORMAL HIGH (ref 22–32)
pCO2 arterial: 70.2 mmHg (ref 32.0–48.0)
pCO2 arterial: 70.6 mmHg (ref 32.0–48.0)
pH, Arterial: 7.239 — ABNORMAL LOW (ref 7.350–7.450)
pH, Arterial: 7.248 — ABNORMAL LOW (ref 7.350–7.450)
pO2, Arterial: 67 mmHg — ABNORMAL LOW (ref 83.0–108.0)
pO2, Arterial: 53 mmHg — ABNORMAL LOW (ref 83.0–108.0)

## 2020-01-26 LAB — COMPREHENSIVE METABOLIC PANEL
ALT: 43 U/L (ref 0–44)
ALT: 47 U/L — ABNORMAL HIGH (ref 0–44)
AST: 65 U/L — ABNORMAL HIGH (ref 15–41)
AST: 58 U/L — ABNORMAL HIGH (ref 15–41)
Albumin: 3.2 g/dL — ABNORMAL LOW (ref 3.5–5.0)
Albumin: 3.1 g/dL — ABNORMAL LOW (ref 3.5–5.0)
Alkaline Phosphatase: 66 U/L (ref 38–126)
Alkaline Phosphatase: 63 U/L (ref 38–126)
Anion gap: 12 (ref 5–15)
Anion gap: 17 — ABNORMAL HIGH (ref 5–15)
BUN: 90 mg/dL — ABNORMAL HIGH (ref 6–20)
BUN: 97 mg/dL — ABNORMAL HIGH (ref 6–20)
CO2: 29 mmol/L (ref 22–32)
CO2: 26 mmol/L (ref 22–32)
Calcium: 8.6 mg/dL — ABNORMAL LOW (ref 8.9–10.3)
Calcium: 8.5 mg/dL — ABNORMAL LOW (ref 8.9–10.3)
Chloride: 101 mmol/L (ref 98–111)
Chloride: 99 mmol/L (ref 98–111)
Creatinine, Ser: 3.14 mg/dL — ABNORMAL HIGH (ref 0.61–1.24)
Creatinine, Ser: 3.23 mg/dL — ABNORMAL HIGH (ref 0.61–1.24)
GFR calc Af Amer: 25 mL/min — ABNORMAL LOW (ref >60)
GFR calc Af Amer: 24 mL/min — ABNORMAL LOW (ref >60)
GFR calc non Af Amer: 21 mL/min — ABNORMAL LOW (ref >60)
GFR calc non Af Amer: 21 mL/min — ABNORMAL LOW (ref >60)
Glucose, Bld: 181 mg/dL — ABNORMAL HIGH (ref 70–99)
Glucose, Bld: 211 mg/dL — ABNORMAL HIGH (ref 70–99)
Potassium: 4.9 mmol/L (ref 3.5–5.1)
Potassium: 4.6 mmol/L (ref 3.5–5.1)
Sodium: 142 mmol/L (ref 135–145)
Sodium: 142 mmol/L (ref 135–145)
Total Bilirubin: 0.6 mg/dL (ref 0.3–1.2)
Total Bilirubin: 0.3 mg/dL (ref 0.3–1.2)
Total Protein: 7.4 g/dL (ref 6.5–8.1)
Total Protein: 7.3 g/dL (ref 6.5–8.1)

## 2020-01-26 LAB — BASIC METABOLIC PANEL
Anion gap: 13 (ref 5–15)
BUN: 101 mg/dL — ABNORMAL HIGH (ref 6–20)
CO2: 26 mmol/L (ref 22–32)
Calcium: 8.6 mg/dL — ABNORMAL LOW (ref 8.9–10.3)
Chloride: 102 mmol/L (ref 98–111)
Creatinine, Ser: 3.61 mg/dL — ABNORMAL HIGH (ref 0.61–1.24)
GFR calc Af Amer: 21 mL/min — ABNORMAL LOW (ref >60)
GFR calc non Af Amer: 18 mL/min — ABNORMAL LOW (ref >60)
Glucose, Bld: 194 mg/dL — ABNORMAL HIGH (ref 70–99)
Potassium: 4.9 mmol/L (ref 3.5–5.1)
Sodium: 141 mmol/L (ref 135–145)

## 2020-01-26 LAB — CBC WITH DIFFERENTIAL/PLATELET
Abs Immature Granulocytes: 0.9 10*3/uL — ABNORMAL HIGH (ref 0.00–0.07)
Abs Immature Granulocytes: 1.11 10*3/uL — ABNORMAL HIGH (ref 0.00–0.07)
Basophils Absolute: 0.1 10*3/uL (ref 0.0–0.1)
Basophils Absolute: 0.1 10*3/uL (ref 0.0–0.1)
Basophils Relative: 0 %
Basophils Relative: 0 %
Eosinophils Absolute: 0 10*3/uL (ref 0.0–0.5)
Eosinophils Absolute: 0 10*3/uL (ref 0.0–0.5)
Eosinophils Relative: 0 %
Eosinophils Relative: 0 %
HCT: 44.1 % (ref 39.0–52.0)
HCT: 44.6 % (ref 39.0–52.0)
Hemoglobin: 13.3 g/dL (ref 13.0–17.0)
Hemoglobin: 13.6 g/dL (ref 13.0–17.0)
Immature Granulocytes: 4 %
Immature Granulocytes: 4 %
Lymphocytes Relative: 6 %
Lymphocytes Relative: 5 %
Lymphs Abs: 1.4 10*3/uL (ref 0.7–4.0)
Lymphs Abs: 1.4 10*3/uL (ref 0.7–4.0)
MCH: 30.1 pg (ref 26.0–34.0)
MCH: 29.9 pg (ref 26.0–34.0)
MCHC: 30.2 g/dL (ref 30.0–36.0)
MCHC: 30.5 g/dL (ref 30.0–36.0)
MCV: 99.8 fL (ref 80.0–100.0)
MCV: 98 fL (ref 80.0–100.0)
Monocytes Absolute: 2.2 10*3/uL — ABNORMAL HIGH (ref 0.1–1.0)
Monocytes Absolute: 2.3 10*3/uL — ABNORMAL HIGH (ref 0.1–1.0)
Monocytes Relative: 9 %
Monocytes Relative: 8 %
Neutro Abs: 18.7 10*3/uL — ABNORMAL HIGH (ref 1.7–7.7)
Neutro Abs: 24.4 10*3/uL — ABNORMAL HIGH (ref 1.7–7.7)
Neutrophils Relative %: 81 %
Neutrophils Relative %: 83 %
Platelets: 343 10*3/uL (ref 150–400)
Platelets: 399 10*3/uL (ref 150–400)
RBC: 4.42 MIL/uL (ref 4.22–5.81)
RBC: 4.55 MIL/uL (ref 4.22–5.81)
RDW: 13.2 % (ref 11.5–15.5)
RDW: 13.1 % (ref 11.5–15.5)
WBC: 23.3 10*3/uL — ABNORMAL HIGH (ref 4.0–10.5)
WBC: 29.3 10*3/uL — ABNORMAL HIGH (ref 4.0–10.5)
nRBC: 0.1 % (ref 0.0–0.2)
nRBC: 0.1 % (ref 0.0–0.2)

## 2020-01-26 LAB — CULTURE, BLOOD (ROUTINE X 2)
Culture: NO GROWTH
Culture: NO GROWTH
Special Requests: ADEQUATE

## 2020-01-26 LAB — FERRITIN: Ferritin: 224 ng/mL (ref 24–336)

## 2020-01-26 LAB — C-REACTIVE PROTEIN
CRP: 3.1 mg/dL — ABNORMAL HIGH (ref <1.0)
CRP: 2.8 mg/dL — ABNORMAL HIGH (ref <1.0)

## 2020-01-26 LAB — CBC
HCT: 41.7 % (ref 39.0–52.0)
Hemoglobin: 12.9 g/dL — ABNORMAL LOW (ref 13.0–17.0)
MCH: 30.1 pg (ref 26.0–34.0)
MCHC: 30.9 g/dL (ref 30.0–36.0)
MCV: 97.4 fL (ref 80.0–100.0)
Platelets: 362 10*3/uL (ref 150–400)
RBC: 4.28 MIL/uL (ref 4.22–5.81)
RDW: 13.2 % (ref 11.5–15.5)
WBC: 26.7 10*3/uL — ABNORMAL HIGH (ref 4.0–10.5)
nRBC: 0.1 % (ref 0.0–0.2)

## 2020-01-26 LAB — D-DIMER, QUANTITATIVE: D-Dimer, Quant: 1.32 ug/mL-FEU — ABNORMAL HIGH (ref 0.00–0.50)

## 2020-01-26 LAB — AMMONIA: Ammonia: 60 umol/L — ABNORMAL HIGH (ref 9–35)

## 2020-01-26 LAB — GLUCOSE, CAPILLARY
Glucose-Capillary: 165 mg/dL — ABNORMAL HIGH (ref 70–99)
Glucose-Capillary: 168 mg/dL — ABNORMAL HIGH (ref 70–99)
Glucose-Capillary: 168 mg/dL — ABNORMAL HIGH (ref 70–99)
Glucose-Capillary: 174 mg/dL — ABNORMAL HIGH (ref 70–99)
Glucose-Capillary: 175 mg/dL — ABNORMAL HIGH (ref 70–99)
Glucose-Capillary: 189 mg/dL — ABNORMAL HIGH (ref 70–99)

## 2020-01-26 LAB — PROTIME-INR
INR: 1.2 (ref 0.8–1.2)
Prothrombin Time: 15 seconds (ref 11.4–15.2)

## 2020-01-26 LAB — MAGNESIUM
Magnesium: 3.1 mg/dL — ABNORMAL HIGH (ref 1.7–2.4)
Magnesium: 3.2 mg/dL — ABNORMAL HIGH (ref 1.7–2.4)

## 2020-01-26 LAB — PHOSPHORUS
Phosphorus: 9.5 mg/dL — ABNORMAL HIGH (ref 2.5–4.6)
Phosphorus: 8.4 mg/dL — ABNORMAL HIGH (ref 2.5–4.6)

## 2020-01-26 LAB — APTT: aPTT: 33 seconds (ref 24–36)

## 2020-01-26 MED ORDER — ATROPINE SULFATE 1 MG/10ML IJ SOSY
PREFILLED_SYRINGE | INTRAMUSCULAR | Status: AC
  Filled 2020-01-26: qty 10

## 2020-01-26 MED ORDER — "THROMBI-PAD 3""X3"" EX PADS"
1.0000 | MEDICATED_PAD | Freq: Once | CUTANEOUS | Status: AC
  Administered 2020-01-26: 14:00:00 1 via TOPICAL
  Filled 2020-01-26: qty 1

## 2020-01-26 MED ORDER — SODIUM CHLORIDE 0.9 % IR SOLN
3000.0000 mL | Status: DC
  Administered 2020-01-26: 16:00:00 3000 mL

## 2020-01-26 NOTE — Progress Notes (Addendum)
NAME:  Gregory Hampton, MRN:  [id number], DOB:  1966/03/18, LOS: 5 ADMISSION DATE:  02-20-20, CONSULTATION DATE: 01/23/2020 REFERRING MD: Vance Gather, MD, CHIEF COMPLAINT: COVID-19 pneumonia  Brief History   54 year old with diabetes, obesity, hypertension, hyperlipidemia admitted on 2/2 with severe COVID-19 pneumonia Treated with steroids, remdesivir, Tocilizumab and convalescent plasma.  PCCM consulted on 2/4 for worsening oxygen requirement  Past Medical History    has a past medical history of Diabetes mellitus without complication ([place]), High cholesterol, Hypertension, and Obesity.  Significant Hospital Events   2/2 Admit 2/5 Intubated 2/6 Proned, paralyzed 2/7 Developed hematuria  Consults:  PCCM  Procedures:  OETT 2/5 >> Lt IJ CVL 2/5 >> Rt radial A line 2/6 >>  Significant Diagnostic Tests:  Chest x-ray 2/7-stable lines and tubes with low lung volumes, bilateral infiltrates.  Micro Data:  Blood cultures 2/2-negative  Antimicrobials/COVID RX  Convalescent plasma 2/2 Tocilizumab 2/2 Azithromycin 2/2 >> 2/5 Ceftriaxone 2/2 >> 2/6 Remdesivir 2/2 >> 2/6  Solu-Medrol 2/2 >   Interim history/subjective:  Proned overnight.  Remains on the ventilator Developed significant hematuria  Objective   Blood pressure 119/77, pulse 64, temperature 97.7 F (36.5 C), temperature source Axillary, resp. rate (!) 30, height 5\' 10"  (1.778 m), weight (!) 203.2 kg, SpO2 94 %.    Vent Mode: PRVC FiO2 (%):  [90 %-100 %] 90 % Set Rate:  [30 bmp] 30 bmp Vt Set:  [430 mL] 430 mL PEEP:  [14 cmH20-16 cmH20] 16 cmH20 Plateau Pressure:  [26 cmH20-32 cmH20] 30 cmH20   Intake/Output Summary (Last 24 hours) at 01/26/2020 0835 Last data filed at 01/26/2020 0500 Gross per 24 hour  Intake 1245.68 ml  Output 1830 ml  Net -584.32 ml   Filed Weights   02/20/20 0906 Feb 20, 2020 0910  Weight: (!) 202.8 kg (!) 203.2 kg    Examination: Gen:      No acute distress, obese HEENT:  EOMI, sclera  anicteric, ETT Neck:     No masses; no thyromegaly Lungs:    Clear to auscultation bilaterally; normal respiratory effort CV:         Regular rate and rhythm; no murmurs Abd:      + bowel sounds; soft, non-tender; no palpable masses, no distension Ext:    No edema; adequate peripheral perfusion Skin:      Warm and dry; no rash Neuro: Sedated   Resolved Hospital Problem list     Assessment & Plan:  Severe COVID-19 pneumonia Received full treatment options including steroids, remdesivir, plasma and Tocilizumab but progressed to intubation ARDS net protocol.  Target plateau pressure less than 30 and driving pressure less than 15 Proning, paralysis protocol Continue steroids, remdesivir  DM SSI coverage Trajenda  Hematuria Likely foley trauma Hold lovenox for today May need CBI if this continues.   AKI Likely secondary to urinary obstruction from blood clot. Now having good urine ouput with condom cath Hold lasix for today Follow BMP  HTN Continue atenolol Labetelol PRN  Best practice:  Diet: Tube feeds Pain/Anxiety/Delirium protocol (if indicated): Dilaudid, precedex, propofol gtt RASS goal -4 VAP protocol (if indicated): Ordered DVT prophylaxis: Lovenox GI prophylaxis: Protonix Glucose control: SSI, Tradjenta Mobility: Bed Code Status: Full Family Communication: Family updated 2/7 Disposition: ICU   Critical care time:    The patient is critically ill with multiple organ system failure and requires high complexity decision making for assessment and support, frequent evaluation and titration of therapies, advanced monitoring, review of radiographic studies and interpretation of  complex data.   Critical Care Time devoted to patient care services, exclusive of separately billable procedures, described in this note is 45 minutes.   [name] MD [facility] Please see [email or web address] for pager details.  01/26/2020, 8:35 AM

## 2020-01-26 NOTE — Progress Notes (Addendum)
PCCM interval progress note  Responded to CODE BLUE to patient bedside 1/2 hr after proning patient became bradycardic to the 30s.  Given 1 amp of atropine and immediately turned supine with recovery of heart rate.  He did not lose pulses and no chest compressions administered  Blood pressure 124/78, pulse 79, temperature 97.9 F (36.6 C), temperature source Oral, resp. rate (!) 35, height 5\' 10"  (1.778 m), weight (!) 203.2 kg, SpO2 97 %. Gen:      No acute distress, obese HEENT:  EOMI, sclera anicteric Neck:     No masses; no thyromegaly, ETT Lungs:    Clear to auscultation bilaterally; normal respiratory effort CV:         Regular rate and rhythm; no murmurs Abd:      + bowel sounds; soft, non-tender; no palpable masses, no distension Ext:    No edema; adequate peripheral perfusion Skin:      Warm and dry; no rash Neuro: Sedated, unresponsive  Assessment/plan Severe COVID-19 pneumonia Bradycardic arrest while prone Currently has good heart rate, hemodynamics Patient is currently getting CBI for hematuria.  This may be a vagal episode Hold off on further proning for now  Family updated.   The patient is critically ill with multiple organ system failure and requires high complexity decision making for assessment and support, frequent evaluation and titration of therapies, advanced monitoring, review of radiographic studies and interpretation of complex data.   Critical Care Time devoted to patient care services, exclusive of separately billable procedures, described in this note is 35 minutes.   MD [facility] Please see [email or web address] for pager details.  01/26/2020, 7:12 PM

## 2020-01-26 NOTE — Progress Notes (Signed)
Pt proned at this time with no complications. Pt stable throughout. Ett securely taped and in proper position

## 2020-01-26 NOTE — Progress Notes (Signed)
eLink Physician-Brief Progress Note Patient Name: Joseantonio Dittmar DOB: [birth year] MRN: [id number]   Date of Service  01/26/2020  HPI/Events of Note  Camera check on this patient was performed.  Vitals stable. BP 108/68 (79) by A-line. HR 66, SpO2 98%.  Vent: VC 430x35, PEEP 16, FiO2 1.0  Synchronous with ventilator. Net even I/O balance for 2/7 so far.   eICU Interventions  Wean FiO2 first as per ARDSNet table. No other changes at this time.     Intervention Category Major Interventions: Other:  Janae Bridgeman 01/26/2020, 10:28 PM

## 2020-01-26 NOTE — Progress Notes (Signed)
RT Note:  Patient returned to supine position. ETT remains secure.

## 2020-01-26 NOTE — Progress Notes (Signed)
ABG panic values given to Dr Delton Coombes at 0458 PH 7.23 C02 70 Pa02 67 HC03 30.2 by Jacques Navy RRT RCP

## 2020-01-26 NOTE — Progress Notes (Signed)
Family Update:  I called the family and spoke with both of this patient's daughters regarding his hospitalization. I gave updates and answered all questions. I expressed that the pt started passing blood through his urine and that he still was requiring significant amounts of oxygen. The family verified understanding.

## 2020-01-27 ENCOUNTER — Inpatient Hospital Stay (HOSPITAL_COMMUNITY): Payer: Medicare HMO

## 2020-01-27 DIAGNOSIS — L8990 Pressure ulcer of unspecified site, unspecified stage: Secondary | ICD-10-CM | POA: Insufficient documentation

## 2020-01-27 DIAGNOSIS — U071 COVID-19: Principal | ICD-10-CM

## 2020-01-27 DIAGNOSIS — Z6841 Body Mass Index (BMI) 40.0 and over, adult: Secondary | ICD-10-CM

## 2020-01-27 DIAGNOSIS — J9600 Acute respiratory failure, unspecified whether with hypoxia or hypercapnia: Secondary | ICD-10-CM

## 2020-01-27 LAB — POCT I-STAT 7, (LYTES, BLD GAS, ICA,H+H)
Acid-Base Excess: 4 mmol/L — ABNORMAL HIGH (ref 0.0–2.0)
Acid-Base Excess: 8 mmol/L — ABNORMAL HIGH (ref 0.0–2.0)
Bicarbonate: 32.9 mmol/L — ABNORMAL HIGH (ref 20.0–28.0)
Bicarbonate: 36.6 mmol/L — ABNORMAL HIGH (ref 20.0–28.0)
Calcium, Ion: 1.24 mmol/L (ref 1.15–1.40)
Calcium, Ion: 1.29 mmol/L (ref 1.15–1.40)
HCT: 42 % (ref 39.0–52.0)
HCT: 44 % (ref 39.0–52.0)
Hemoglobin: 14.3 g/dL (ref 13.0–17.0)
Hemoglobin: 15 g/dL (ref 13.0–17.0)
O2 Saturation: 92 %
O2 Saturation: 88 %
Patient temperature: 98.2
Patient temperature: 97.7
Potassium: 4.8 mmol/L (ref 3.5–5.1)
Potassium: 4.8 mmol/L (ref 3.5–5.1)
Sodium: 146 mmol/L — ABNORMAL HIGH (ref 135–145)
Sodium: 149 mmol/L — ABNORMAL HIGH (ref 135–145)
TCO2: 35 mmol/L — ABNORMAL HIGH (ref 22–32)
TCO2: 39 mmol/L — ABNORMAL HIGH (ref 22–32)
pCO2 arterial: 70.8 mmHg (ref 32.0–48.0)
pCO2 arterial: 65 mmHg — ABNORMAL HIGH (ref 32.0–48.0)
pH, Arterial: 7.275 — ABNORMAL LOW (ref 7.350–7.450)
pH, Arterial: 7.357 (ref 7.350–7.450)
pO2, Arterial: 74 mmHg — ABNORMAL LOW (ref 83.0–108.0)
pO2, Arterial: 58 mmHg — ABNORMAL LOW (ref 83.0–108.0)

## 2020-01-27 LAB — GLUCOSE, CAPILLARY
Glucose-Capillary: 180 mg/dL — ABNORMAL HIGH (ref 70–99)
Glucose-Capillary: 195 mg/dL — ABNORMAL HIGH (ref 70–99)
Glucose-Capillary: 203 mg/dL — ABNORMAL HIGH (ref 70–99)
Glucose-Capillary: 198 mg/dL — ABNORMAL HIGH (ref 70–99)
Glucose-Capillary: 228 mg/dL — ABNORMAL HIGH (ref 70–99)
Glucose-Capillary: 196 mg/dL — ABNORMAL HIGH (ref 70–99)

## 2020-01-27 LAB — CBC
HCT: 44.4 % (ref 39.0–52.0)
Hemoglobin: 13.3 g/dL (ref 13.0–17.0)
MCH: 29.7 pg (ref 26.0–34.0)
MCHC: 30 g/dL (ref 30.0–36.0)
MCV: 99.1 fL (ref 80.0–100.0)
Platelets: 437 10*3/uL — ABNORMAL HIGH (ref 150–400)
RBC: 4.48 MIL/uL (ref 4.22–5.81)
RDW: 13.2 % (ref 11.5–15.5)
WBC: 30 10*3/uL — ABNORMAL HIGH (ref 4.0–10.5)
nRBC: 0.2 % (ref 0.0–0.2)

## 2020-01-27 LAB — BASIC METABOLIC PANEL
Anion gap: 11 (ref 5–15)
BUN: 91 mg/dL — ABNORMAL HIGH (ref 6–20)
CO2: 30 mmol/L (ref 22–32)
Calcium: 8.9 mg/dL (ref 8.9–10.3)
Chloride: 104 mmol/L (ref 98–111)
Creatinine, Ser: 2.34 mg/dL — ABNORMAL HIGH (ref 0.61–1.24)
GFR calc Af Amer: 35 mL/min — ABNORMAL LOW (ref >60)
GFR calc non Af Amer: 30 mL/min — ABNORMAL LOW (ref >60)
Glucose, Bld: 235 mg/dL — ABNORMAL HIGH (ref 70–99)
Potassium: 4.6 mmol/L (ref 3.5–5.1)
Sodium: 145 mmol/L (ref 135–145)

## 2020-01-27 LAB — MAGNESIUM: Magnesium: 3.3 mg/dL — ABNORMAL HIGH (ref 1.7–2.4)

## 2020-01-27 LAB — MRSA PCR SCREENING: MRSA by PCR: NEGATIVE

## 2020-01-27 LAB — TRIGLYCERIDES: Triglycerides: 538 mg/dL — ABNORMAL HIGH (ref <150)

## 2020-01-27 LAB — PHOSPHORUS: Phosphorus: 5.9 mg/dL — ABNORMAL HIGH (ref 2.5–4.6)

## 2020-01-27 MED ORDER — METHYLPREDNISOLONE SODIUM SUCC 40 MG IJ SOLR
40.0000 mg | Freq: Every day | INTRAMUSCULAR | Status: DC
  Administered 2020-01-28 – 2020-01-30 (×3): 40 mg via INTRAVENOUS
  Filled 2020-01-27 (×3): qty 1

## 2020-01-27 MED ORDER — "THROMBI-PAD 3""X3"" EX PADS"
1.0000 | MEDICATED_PAD | Freq: Once | CUTANEOUS | Status: AC
  Administered 2020-01-27: 1 via TOPICAL
  Filled 2020-01-27: qty 1

## 2020-01-27 MED ORDER — FUROSEMIDE 10 MG/ML IJ SOLN
80.0000 mg | Freq: Two times a day (BID) | INTRAMUSCULAR | Status: AC
  Administered 2020-01-27 (×2): 80 mg via INTRAVENOUS
  Filled 2020-01-27 (×2): qty 8

## 2020-01-27 MED ORDER — ROCURONIUM BROMIDE 10 MG/ML (PF) SYRINGE
PREFILLED_SYRINGE | INTRAVENOUS | Status: AC
  Administered 2020-01-27: 11:00:00 100 mg via INTRAVENOUS
  Filled 2020-01-27: qty 10

## 2020-01-27 MED ORDER — ROCURONIUM BROMIDE 10 MG/ML (PF) SYRINGE
PREFILLED_SYRINGE | INTRAVENOUS | Status: AC
  Filled 2020-01-27: qty 10

## 2020-01-27 MED ORDER — CLONAZEPAM 1 MG PO TABS
1.0000 mg | ORAL_TABLET | Freq: Two times a day (BID) | ORAL | Status: DC
  Administered 2020-01-27 – 2020-02-01 (×11): 1 mg
  Filled 2020-01-27 (×11): qty 1

## 2020-01-27 MED ORDER — ROCURONIUM BROMIDE 10 MG/ML (PF) SYRINGE: 100.0000 mg | PREFILLED_SYRINGE | Freq: Once | INTRAVENOUS | Status: AC

## 2020-01-27 MED ORDER — ENOXAPARIN SODIUM 100 MG/ML ~~LOC~~ SOLN
90.0000 mg | Freq: Two times a day (BID) | SUBCUTANEOUS | Status: DC
  Administered 2020-01-27 – 2020-02-01 (×11): 90 mg via SUBCUTANEOUS
  Filled 2020-01-27 (×13): qty 1

## 2020-01-27 MED ORDER — OXYCODONE HCL 5 MG PO TABS
5.0000 mg | ORAL_TABLET | Freq: Four times a day (QID) | ORAL | Status: DC
  Administered 2020-01-27 – 2020-02-01 (×19): 5 mg
  Filled 2020-01-27 (×19): qty 1

## 2020-01-27 NOTE — Procedures (Signed)
Intubation Procedure Note Gregory Hampton [id number] [birth year]  Procedure: Intubation Indications: ETT needed to be changed  Procedure Details Consent: Unable to obtain consent because of emergent medical necessity. Time Out: Verified patient identification, verified procedure, site/side was marked, verified correct patient position, special equipment/implants available, medications/allergies/relevent history reviewed, required imaging and test results available.  Performed  Drugs Rocuronium, nimbex, fentanyl, propofol DL x 2 with MAC 4 blade Grade 1 view 8.0 tube passed through cords under direct visualization, cuff blown 8.5 ETT tube passed through cords under direct visualization, in place Placement confirmed with bilateral breath sounds, positive EtCO2 change and smoke in tube   Evaluation Hemodynamic Status: BP stable throughout; O2 sats: transiently fell during during procedure Patient's Current Condition: stable Complications: No apparent complications Patient did tolerate procedure well. Chest X-ray ordered to verify placement.  CXR: pending.   Roselie Awkward 01/27/2020

## 2020-01-27 NOTE — Progress Notes (Addendum)
This RN called patient's sister at start of shift  (1900 2/7) to update her on bradycardia event.  I was able to answer all questions pertaining to the episode and the patient's current care. Overnight the patient's HR was stable (49-75). Patient is on 13 mcg of levophed, paralyzed with nimbex @3 , and on propofol and dilaudid with a BIS of 45-60. Patient had continuous bladder irrigation which was paused at 0600 after urine turned clear/yellow, Will follow urine output. Pt on 100%/16PEEP. ABG drawn this morning. PO2 78. Per MD order, continue to hold off on proning.

## 2020-01-27 NOTE — Progress Notes (Signed)
NAMEAmine Hampton, MRN:  [id number], DOB:  05-26-1966, LOS: 14 ADMISSION DATE:  02/11/2020, CONSULTATION DATE:  2/4 REFERRING MD:  Dr. Bonner Puna, CHIEF COMPLAINT:  COVID 19 pneumonia   Brief History    54 y/o with admitted on 2/2 with severe COVID 19 pneumonia. Intubated on 2/5, placed in prone position. Bradycardic cardiac arrest on 2/7   Past Medical History  DM2 HTN HLD Obesity  Significant Hospital Events   2/2 Admit 2/5 Intubated 2/6 Proned, paralyzed 2/7 Developed hematuria, bradycardic event,near arrest 2/8 ETT exchanged  Consults:  PCCM  Procedures:  OETT 2/5 >> Lt IJ CVL 2/5 >> Rt radial A line 2/6 >>  Significant Diagnostic Tests:    Micro Data:  Blood cultures 2/2-negative  Antimicrobials:  Convalescent plasma 2/2 Tocilizumab 2/2 Azithromycin 2/2 >> 2/5  Ceftriaxone 2/2 >> 2/6 Remdesivir 2/2 >> 2/6  Solu-Medrol 2/2 >   Interim history/subjective:   Severe bradycardia prone, near arrest Hematuria on 2/7 > resolved with CBI  Objective   Blood pressure 131/78, pulse 66, temperature 98.2 F (36.8 C), temperature source Oral, resp. rate (!) 35, height 5\' 10"  (1.778 m), weight (!) 187 kg, SpO2 94 %.    Vent Mode: PRVC FiO2 (%):  [40 %-100 %] 40 % Set Rate:  [35 bmp] 35 bmp Vt Set:  [430 mL] 430 mL PEEP:  [16 cmH20] 16 cmH20 Plateau Pressure:  [27 cmH20-33 cmH20] 28 cmH20   Intake/Output Summary (Last 24 hours) at 01/27/2020 0809 Last data filed at 01/27/2020 0700 Gross per 24 hour  Intake 7866.8 ml  Output 6720 ml  Net 1146.8 ml   Filed Weights   02/11/20 0906 2020-02-11 0910 01/27/20 0500  Weight: (!) 202.8 kg (!) 203.2 kg (!) 187 kg    Examination:  General:  In bed on vent HENT: NCAT ETT in place PULM: CTA B, vent supported breathing CV: RRR, no mgr GI: BS+, soft, nontender MSK: normal bulk and tone Neuro: sedated on vent    Resolved Hospital Problem list     Assessment & Plan:  COVID 19 pneumonia ARDS resp acidosis,  likely cuff leak contributing Continue mechanical ventilation per ARDS protocol Target TVol 6-8cc/kgIBW Target Plateau Pressure < 30cm H20 Target driving pressure less than 15 cm of water Target PaO2 55-65: titrate PEEP/FiO2 per protocol As long as PaO2 to FiO2 ratio is less than 1:150 position in prone position for 16 hours a day Check CVP daily if CVL in place Target CVP less than 4, diurese as necessary Ventilator associated pneumonia prevention protocol 12/8 Cuff leak > ETT exchange No prone with bradycardia Stop propofol Change to versed (hypotention Decrease steroid Resume lasix Nimbex protocol to continue  Need for sedation Continuous paralytic protocol again today Re-assess need tomorrow RASS target -5  Shock Prop to versed Wean off levophed  DM2 Decrease steroid  Hematuria CBI Restart heparin today for dvt prophylaxis  Cardiac arrest, bradycardia, in prone Tele Replace ett Monitor ABG/pH  AKI> improved Lasix today Monitor BMET and UOP Replace electrolytes as needed    Best practice:  Diet: tube feeding Pain/Anxiety/Delirium protocol (if indicated): cont paralytic protocol to continue, as above VAP protocol (if indicated): yes DVT prophylaxis: restart today GI prophylaxis: Pantoprazole for stress ulcer prophylaxis Glucose control: SSI, linagliptin Mobility: bed rest Code Status: full Family Communication: I updated his sister by phone Disposition: remain in ICU  Labs   CBC: Recent Labs  Lab 01/23/20 0345 01/23/20 0345 01/24/20 0145 01/24/20 1531 01/25/20 0505 01/25/20 0505  01/26/20 0450 01/26/20 0452 01/26/20 0835 01/26/20 1527 01/26/20 1935 01/27/20 0445 01/27/20 0537  WBC 21.8*   < > 23.6*  --  23.4*  --  23.3*  --   --  26.7* 29.3* 30.0*  --   NEUTROABS 19.0*  --  19.8*  --  19.3*  --  18.7*  --   --   --  24.4*  --   --   HGB 13.7   < > 14.0   < > 12.4*   < > 13.3   < > 13.9 12.9* 13.6 13.3 14.3  HCT 43.4   < > 45.4   < > 41.1    < > 44.1   < > 41.0 41.7 44.6 44.4 42.0  MCV 93.9   < > 94.4  --  97.2  --  99.8  --   --  97.4 98.0 99.1  --   PLT 351   < > 389  --  372  --  343  --   --  362 399 437*  --    < > = values in this interval not displayed.    Basic Metabolic Panel: Recent Labs  Lab 01/22/20 0205 01/23/20 0345 01/25/20 0505 01/25/20 0505 01/26/20 0450 01/26/20 5993 01/26/20 0835 01/26/20 1527 01/26/20 1935 01/27/20 0445 01/27/20 0537  NA 138   < > 141   < > 142   < > 142 141 142 145 146*  K 4.4   < > 3.2*   < > 4.9   < > 4.9 4.9 4.6 4.6 4.8  CL 99   < > 107  --  101  --   --  102 99 104  --   CO2 28   < > 22  --  29  --   --  26 26 30   --   GLUCOSE 163*   < > 147*  --  181*  --   --  194* 211* 235*  --   BUN 23*   < > 47*  --  90*  --   --  101* 97* 91*  --   CREATININE 1.11   < > 1.40*  --  3.14*  --   --  3.61* 3.23* 2.34*  --   CALCIUM 9.5   < > 6.1*  --  8.6*  --   --  8.6* 8.5* 8.9  --   MG 2.3  --   --   --  3.1*  --   --   --  3.2* 3.3*  --   PHOS 4.8*  --   --   --  9.5*  --   --   --  8.4* 5.9*  --    < > = values in this interval not displayed.   GFR: Estimated Creatinine Clearance: 60.5 mL/min (A) (by C-G formula based on SCr of 2.34 mg/dL (H)). Recent Labs  Lab Jan 30, 2020 0913 01/22/20 0205 01/26/20 0450 01/26/20 1527 01/26/20 1935 01/27/20 0445  PROCALCITON 0.50  --   --   --   --   --   WBC 13.9*   < > 23.3* 26.7* 29.3* 30.0*  LATICACIDVEN 1.9  --   --   --  1.6  --    < > = values in this interval not displayed.    Liver Function Tests: Recent Labs  Lab 01/23/20 0345 01/24/20 0145 01/25/20 0505 01/26/20 0450 01/26/20 1935  AST 48* 50* 24 65* 58*  ALT  39 41 28 43 47*  ALKPHOS 59 67 39 66 63  BILITOT 0.8 0.6 0.3 0.6 0.3  PROT 9.0* 8.7* 5.2* 7.4 7.3  ALBUMIN 3.6 3.4* 2.2* 3.2* 3.1*   No results for input(s): LIPASE, AMYLASE in the last 168 hours. Recent Labs  Lab 01/26/20 1935  AMMONIA 60*    ABG    Component Value Date/Time   PHART 7.275 (L)  01/27/2020 0537   PCO2ART 70.8 (HH) 01/27/2020 0537   PO2ART 74.0 (L) 01/27/2020 0537   HCO3 32.9 (H) 01/27/2020 0537   TCO2 35 (H) 01/27/2020 0537   O2SAT 92.0 01/27/2020 0537     Coagulation Profile: Recent Labs  Lab 01/26/20 1935  INR 1.2    Cardiac Enzymes: No results for input(s): CKTOTAL, CKMB, CKMBINDEX, TROPONINI in the last 168 hours.  HbA1C: Hgb A1c MFr Bld  Date/Time Value Ref Range Status  01/23/2020 03:45 AM 7.0 (H) 4.8 - 5.6 % Final    Comment:    (NOTE) Pre diabetes:          5.7%-6.4% Diabetes:              >6.4% Glycemic control for   <7.0% adults with diabetes     CBG: Recent Labs  Lab 01/26/20 1129 01/26/20 1635 01/26/20 1952 01/26/20 2331 01/27/20 0737  GLUCAP 168* 175* 165* 174* 203*     Critical care time: 35 minutes    Heber San [place] I, MD [facility] Pager: [phone] Cell: [phone] If no response, call [phone]

## 2020-01-27 NOTE — Progress Notes (Signed)
Family update:  I called the pt's sister and gave updates and answered all questions The family has no further questions or concerns at this time. I informed them that the pt is no longer having bleeding in his urine. They verified understanding.

## 2020-01-28 ENCOUNTER — Inpatient Hospital Stay (HOSPITAL_COMMUNITY): Payer: Medicare HMO

## 2020-01-28 DIAGNOSIS — E119 Type 2 diabetes mellitus without complications: Secondary | ICD-10-CM

## 2020-01-28 DIAGNOSIS — R0600 Dyspnea, unspecified: Secondary | ICD-10-CM

## 2020-01-28 LAB — GLUCOSE, CAPILLARY
Glucose-Capillary: 193 mg/dL — ABNORMAL HIGH (ref 70–99)
Glucose-Capillary: 248 mg/dL — ABNORMAL HIGH (ref 70–99)
Glucose-Capillary: 297 mg/dL — ABNORMAL HIGH (ref 70–99)
Glucose-Capillary: 306 mg/dL — ABNORMAL HIGH (ref 70–99)
Glucose-Capillary: 312 mg/dL — ABNORMAL HIGH (ref 70–99)
Glucose-Capillary: 312 mg/dL — ABNORMAL HIGH (ref 70–99)

## 2020-01-28 LAB — POCT I-STAT 7, (LYTES, BLD GAS, ICA,H+H)
Acid-Base Excess: 8 mmol/L — ABNORMAL HIGH (ref 0.0–2.0)
Bicarbonate: 37.5 mmol/L — ABNORMAL HIGH (ref 20.0–28.0)
Calcium, Ion: 1.31 mmol/L (ref 1.15–1.40)
HCT: 42 % (ref 39.0–52.0)
Hemoglobin: 14.3 g/dL (ref 13.0–17.0)
O2 Saturation: 88 %
Patient temperature: 37.6
Potassium: 4.8 mmol/L (ref 3.5–5.1)
Sodium: 154 mmol/L — ABNORMAL HIGH (ref 135–145)
TCO2: 40 mmol/L — ABNORMAL HIGH (ref 22–32)
pCO2 arterial: 76.6 mmHg (ref 32.0–48.0)
pH, Arterial: 7.301 — ABNORMAL LOW (ref 7.350–7.450)
pO2, Arterial: 65 mmHg — ABNORMAL LOW (ref 83.0–108.0)

## 2020-01-28 LAB — CBC WITH DIFFERENTIAL/PLATELET
Abs Immature Granulocytes: 0.97 10*3/uL — ABNORMAL HIGH (ref 0.00–0.07)
Basophils Absolute: 0.1 10*3/uL (ref 0.0–0.1)
Basophils Relative: 1 %
Eosinophils Absolute: 0 10*3/uL (ref 0.0–0.5)
Eosinophils Relative: 0 %
HCT: 45.9 % (ref 39.0–52.0)
Hemoglobin: 13.4 g/dL (ref 13.0–17.0)
Immature Granulocytes: 4 %
Lymphocytes Relative: 8 %
Lymphs Abs: 2.2 10*3/uL (ref 0.7–4.0)
MCH: 29.3 pg (ref 26.0–34.0)
MCHC: 29.2 g/dL — ABNORMAL LOW (ref 30.0–36.0)
MCV: 100.4 fL — ABNORMAL HIGH (ref 80.0–100.0)
Monocytes Absolute: 2.6 10*3/uL — ABNORMAL HIGH (ref 0.1–1.0)
Monocytes Relative: 9 %
Neutro Abs: 21.9 10*3/uL — ABNORMAL HIGH (ref 1.7–7.7)
Neutrophils Relative %: 78 %
Platelets: 401 10*3/uL — ABNORMAL HIGH (ref 150–400)
RBC: 4.57 MIL/uL (ref 4.22–5.81)
RDW: 13.5 % (ref 11.5–15.5)
WBC: 27.7 10*3/uL — ABNORMAL HIGH (ref 4.0–10.5)
nRBC: 0.2 % (ref 0.0–0.2)

## 2020-01-28 LAB — BASIC METABOLIC PANEL
Anion gap: 9 (ref 5–15)
BUN: 73 mg/dL — ABNORMAL HIGH (ref 6–20)
CO2: 34 mmol/L — ABNORMAL HIGH (ref 22–32)
Calcium: 9.3 mg/dL (ref 8.9–10.3)
Chloride: 109 mmol/L (ref 98–111)
Creatinine, Ser: 1.71 mg/dL — ABNORMAL HIGH (ref 0.61–1.24)
GFR calc Af Amer: 51 mL/min — ABNORMAL LOW (ref >60)
GFR calc non Af Amer: 44 mL/min — ABNORMAL LOW (ref >60)
Glucose, Bld: 258 mg/dL — ABNORMAL HIGH (ref 70–99)
Potassium: 4.7 mmol/L (ref 3.5–5.1)
Sodium: 152 mmol/L — ABNORMAL HIGH (ref 135–145)

## 2020-01-28 LAB — ECHOCARDIOGRAM COMPLETE
Height: 70 in
Weight: 6172.88 oz

## 2020-01-28 LAB — LACTIC ACID, PLASMA
Lactic Acid, Venous: 2.8 mmol/L (ref 0.5–1.9)
Lactic Acid, Venous: 2.2 mmol/L (ref 0.5–1.9)

## 2020-01-28 LAB — TRIGLYCERIDES: Triglycerides: 531 mg/dL — ABNORMAL HIGH (ref <150)

## 2020-01-28 MED ORDER — NOREPINEPHRINE 16 MG/250ML-% IV SOLN
0.0000 ug/min | INTRAVENOUS | Status: DC
  Administered 2020-01-28: 05:00:00 29 ug/min via INTRAVENOUS
  Filled 2020-01-28 (×2): qty 250

## 2020-01-28 MED ORDER — VECURONIUM BROMIDE 10 MG IV SOLR
10.0000 mg | INTRAVENOUS | Status: DC | PRN
  Administered 2020-01-29 – 2020-01-30 (×5): 10 mg via INTRAVENOUS
  Filled 2020-01-28 (×5): qty 10

## 2020-01-28 MED ORDER — INSULIN ASPART 100 UNIT/ML ~~LOC~~ SOLN
0.0000 [IU] | SUBCUTANEOUS | Status: DC
  Administered 2020-01-28: 15 [IU] via SUBCUTANEOUS
  Administered 2020-01-28: 20:00:00 11 [IU] via SUBCUTANEOUS
  Administered 2020-01-28: 7 [IU] via SUBCUTANEOUS
  Administered 2020-01-29 (×2): 20 [IU] via SUBCUTANEOUS
  Administered 2020-01-29 – 2020-01-30 (×5): 11 [IU] via SUBCUTANEOUS
  Administered 2020-01-30 – 2020-01-31 (×6): 7 [IU] via SUBCUTANEOUS
  Administered 2020-01-31: 20:00:00 4 [IU] via SUBCUTANEOUS
  Administered 2020-01-31: 3 [IU] via SUBCUTANEOUS
  Administered 2020-01-31 (×2): 7 [IU] via SUBCUTANEOUS
  Administered 2020-01-31 – 2020-02-01 (×2): 4 [IU] via SUBCUTANEOUS
  Administered 2020-02-01: 08:00:00 3 [IU] via SUBCUTANEOUS
  Administered 2020-02-01 (×2): 4 [IU] via SUBCUTANEOUS

## 2020-01-28 MED ORDER — INSULIN ASPART 100 UNIT/ML ~~LOC~~ SOLN
3.0000 [IU] | SUBCUTANEOUS | Status: DC
  Administered 2020-01-28 – 2020-01-29 (×6): 3 [IU] via SUBCUTANEOUS

## 2020-01-28 MED ORDER — FREE WATER
300.0000 mL | Status: DC
  Administered 2020-01-28 – 2020-01-29 (×5): 300 mL

## 2020-01-28 MED ORDER — VASOPRESSIN 20 UNIT/ML IV SOLN
0.0400 [IU]/min | INTRAVENOUS | Status: DC
  Administered 2020-01-28 – 2020-01-31 (×3): 0.03 [IU]/min via INTRAVENOUS
  Administered 2020-02-01: 0.04 [IU]/min via INTRAVENOUS
  Filled 2020-01-28 (×7): qty 2

## 2020-01-28 MED ORDER — SODIUM CHLORIDE 0.9 % IV SOLN
2.0000 g | Freq: Three times a day (TID) | INTRAVENOUS | Status: DC
  Administered 2020-01-28 – 2020-01-29 (×3): 2 g via INTRAVENOUS
  Filled 2020-01-28 (×3): qty 2

## 2020-01-28 MED ORDER — PHENYLEPHRINE HCL-NACL 10-0.9 MG/250ML-% IV SOLN
0.0000 ug/min | INTRAVENOUS | Status: DC
  Administered 2020-01-28: 17:00:00 20 ug/min via INTRAVENOUS
  Administered 2020-01-29: 05:00:00 170 ug/min via INTRAVENOUS
  Administered 2020-01-29: 03:00:00 120 ug/min via INTRAVENOUS
  Administered 2020-01-29: 220 ug/min via INTRAVENOUS
  Administered 2020-01-29: 01:00:00 40 ug/min via INTRAVENOUS
  Administered 2020-01-29: 04:00:00 160 ug/min via INTRAVENOUS
  Administered 2020-01-29: 180 ug/min via INTRAVENOUS
  Filled 2020-01-28: qty 250
  Filled 2020-01-28: qty 500
  Filled 2020-01-28 (×3): qty 250
  Filled 2020-01-28: qty 500
  Filled 2020-01-28: qty 250

## 2020-01-28 MED ORDER — POLYETHYLENE GLYCOL 3350 17 G PO PACK
17.0000 g | PACK | Freq: Two times a day (BID) | ORAL | Status: DC
  Administered 2020-01-28 – 2020-02-01 (×8): 17 g
  Filled 2020-01-28 (×8): qty 1

## 2020-01-28 NOTE — Progress Notes (Signed)
Echocardiogram 2D Echocardiogram has been performed.  Warren Lacy Infant Doane 01/28/2020, 2:41 PM

## 2020-01-28 NOTE — Progress Notes (Signed)
CRITICAL VALUE ALERT  Critical Value: lactic acid 2.8   Date & Time Notied:  01/28/20 at 1850  Provider Notified: Henrene Pastor  Orders Received/Actions taken: future labs added

## 2020-01-28 NOTE — Progress Notes (Signed)
[facility] PCCM  Gregory Hampton has persistent tachycardia CVP 21 TTE done, no clear cardiomyopathy, poor windows WBC up Will culture resp/blood Check u/a Start vanc/cefepime Check lactic acid No fluids given CVP  [name], MD [facility] Pager: [phone] Cell: [phone] If no response, call [phone]

## 2020-01-28 NOTE — Progress Notes (Signed)
Pharmacy Antibiotic Note  Gregory Hampton is a 54 y.o. male admitted on 02/15/2020 with COVID-19 pneumonia.  Pharmacy has been consulted for cefepime dosing for HCAP.  Plan: Cefepime 2g IV q8h Follow up renal function, culture results, and clinical course.   Height: 5\' 10"  (177.8 cm) Weight: (!) 385 lb 12.9 oz (175 kg) IBW/kg (Calculated) : 73  Temp (24hrs), Avg:99 F (37.2 C), Min:98.2 F (36.8 C), Max:99.6 F (37.6 C)  Recent Labs  Lab 01/26/20 0450 01/26/20 1527 01/26/20 1935 01/27/20 0445 01/28/20 0415  WBC 23.3* 26.7* 29.3* 30.0* 27.7*  CREATININE 3.14* 3.61* 3.23* 2.34* 1.71*  LATICACIDVEN  --   --  1.6  --   --     Estimated Creatinine Clearance: 79.5 mL/min (A) (by C-G formula based on SCr of 1.71 mg/dL (H)).    Allergies  Allergen Reactions  . Mushroom Extract Complex Rash    Antimicrobials this admission: CTX  2/2 >>2/6 Azith 2/2 >> 2/6 Remdes 2/2 >> 2/6 Tocilizumab 2/2  Cefepime 2/9 >>   Dose adjustments this admission:  Microbiology results: 2/2 Covid + 2/2 BCx: NGF 2/8 MRSA PCR: negative  Thank you for allowing pharmacy to be a part of this patient's care.  4/9 PharmD, BCPS Clinical pharmacist phone 7am- 5pm: [phone] 01/28/2020 3:47 PM

## 2020-01-28 NOTE — Progress Notes (Signed)
NAMELegrand Hampton, MRN:  [id number], DOB:  [birth year], LOS: 7 ADMISSION DATE:  02/17/2020, CONSULTATION DATE:  2/4 REFERRING MD:  Dr. Jarvis Newcomer, CHIEF COMPLAINT:  COVID 19 pneumonia   Brief History    54 y/o with admitted on 2/2 with severe COVID 19 pneumonia. Intubated on 2/5, placed in prone position. Bradycardic cardiac arrest on 2/7   Past Medical History  DM2 HTN HLD Obesity  Significant Hospital Events   2/2 Admit 2/5 Intubated 2/6 Proned, paralyzed 2/7 Developed hematuria, bradycardic event,near arrest 2/8 ETT exchanged  Consults:  PCCM  Procedures:  OETT 2/5 >> Lt IJ CVL 2/5 >> Rt radial A line 2/6 >>  Significant Diagnostic Tests:    Micro Data:  Blood cultures 2/2-negative  Antimicrobials:  Convalescent plasma 2/2 Tocilizumab 2/2 Azithromycin 2/2 >> 2/5  Ceftriaxone 2/2 >> 2/6 Remdesivir 2/2 >> 2/6  Solu-Medrol 2/2 >   Interim history/subjective:   More tachycardia overnight  Objective   Blood pressure 111/77, pulse (!) 123, temperature 99.2 F (37.3 C), temperature source Oral, resp. rate (!) 35, height 5\' 10"  (1.778 m), weight (!) 175 kg, SpO2 93 %.    Vent Mode: PRVC FiO2 (%):  [90 %-100 %] 90 % Set Rate:  [35 bmp] 35 bmp Vt Set:  [430 mL] 430 mL PEEP:  [16 cmH20] 16 cmH20 Plateau Pressure:  [31 cmH20-35 cmH20] 35 cmH20   Intake/Output Summary (Last 24 hours) at 01/28/2020 0745 Last data filed at 01/28/2020 0600 Gross per 24 hour  Intake 3033.94 ml  Output 6075 ml  Net -3041.06 ml   Filed Weights   February 17, 2020 0910 01/27/20 0500 01/28/20 0500  Weight: (!) 203.2 kg (!) 187 kg (!) 175 kg    Examination:  General:  In bed on vent HENT: NCAT ETT in place PULM: CTA B, vent supported breathing CV: Tachy, no mgr GI: BS+, soft, nontender MSK: normal bulk and tone Neuro: sedated on vent    Resolved Hospital Problem list     Assessment & Plan:  COVID 19 pneumonia ARDS Compensated hypercarbia> likely severe dead space from  ARDS Continue mechanical ventilation per ARDS protocol Target TVol 6-8cc/kgIBW Target Plateau Pressure < 30cm H20 Target driving pressure less than 15 cm of water Target PaO2 55-65: titrate PEEP/FiO2 per protocol As long as PaO2 to FiO2 ratio is less than 1:150 position in prone position for 16 hours a day Check CVP daily if CVL in place Target CVP less than 4, diurese as necessary Ventilator associated pneumonia prevention protocol 2/9 change to intermittent paralytic, hold prone given bradycardia event in prone position several days ago  Need for sedation Change to intermittent paralytic protocol RASS target -4 to -5 Dilaudid/versed to continue Continue oxycodone and clonazepam  Sinus tachycardia> sedation related? Cardiac arrest, bradycardia, in prone position on 2/7 CVP 21 Check echocardiogram given high risk of cardiomyopathy Stop paralytic, consider increasing sedation further if sinus tach persists  DM2 with hyperglycemia Add tube feeding coverage today linaglyptin SSI  Hematuria > resolved Hold CBI today Monitor for bleeding with sub cutaneous heparin going for DVT prophlaxis  AKI> improved Monitor BMET and UOP Replace electrolytes as needed    Best practice:  Diet: tube feeding Pain/Anxiety/Delirium protocol (if indicated): cont paralytic protocol to continue, as above VAP protocol (if indicated): yes DVT prophylaxis: restart today GI prophylaxis: Pantoprazole for stress ulcer prophylaxis Glucose control: SSI, linagliptin Mobility: bed rest Code Status: full Family Communication: I tried calling his sister 4/7, had to leave a message Disposition:  remain in ICU  Labs   CBC: Recent Labs  Lab 01/24/20 0145 01/24/20 1531 01/25/20 0505 01/25/20 0505 01/26/20 0450 01/26/20 0452 01/26/20 1527 01/26/20 1527 01/26/20 1935 01/27/20 0445 01/27/20 0537 01/27/20 1309 01/28/20 0415  WBC 23.6*  --  23.4*   < > 23.3*  --  26.7*  --  29.3* 30.0*  --   --   27.7*  NEUTROABS 19.8*  --  19.3*  --  18.7*  --   --   --  24.4*  --   --   --  21.9*  HGB 14.0   < > 12.4*   < > 13.3   < > 12.9*   < > 13.6 13.3 14.3 15.0 13.4  HCT 45.4   < > 41.1   < > 44.1   < > 41.7   < > 44.6 44.4 42.0 44.0 45.9  MCV 94.4  --  97.2   < > 99.8  --  97.4  --  98.0 99.1  --   --  100.4*  PLT 389  --  372   < > 343  --  362  --  399 437*  --   --  401*   < > = values in this interval not displayed.    Basic Metabolic Panel: Recent Labs  Lab 01/22/20 0205 01/23/20 0345 01/26/20 0450 01/26/20 0452 01/26/20 1527 01/26/20 1527 01/26/20 1935 01/27/20 0445 01/27/20 0537 01/27/20 1309 01/28/20 0415  NA 138   < > 142   < > 141   < > 142 145 146* 149* 152*  K 4.4   < > 4.9   < > 4.9   < > 4.6 4.6 4.8 4.8 4.7  CL 99   < > 101  --  102  --  99 104  --   --  109  CO2 28   < > 29  --  26  --  26 30  --   --  34*  GLUCOSE 163*   < > 181*  --  194*  --  211* 235*  --   --  258*  BUN 23*   < > 90*  --  101*  --  97* 91*  --   --  73*  CREATININE 1.11   < > 3.14*  --  3.61*  --  3.23* 2.34*  --   --  1.71*  CALCIUM 9.5   < > 8.6*  --  8.6*  --  8.5* 8.9  --   --  9.3  MG 2.3  --  3.1*  --   --   --  3.2* 3.3*  --   --   --   PHOS 4.8*  --  9.5*  --   --   --  8.4* 5.9*  --   --   --    < > = values in this interval not displayed.   GFR: Estimated Creatinine Clearance: 79.5 mL/min (A) (by C-G formula based on SCr of 1.71 mg/dL (H)). Recent Labs  Lab 2020-02-19 0913 01/22/20 0205 01/26/20 1527 01/26/20 1935 01/27/20 0445 01/28/20 0415  PROCALCITON 0.50  --   --   --   --   --   WBC 13.9*   < > 26.7* 29.3* 30.0* 27.7*  LATICACIDVEN 1.9  --   --  1.6  --   --    < > = values in this interval not displayed.    Liver Function  Tests: Recent Labs  Lab 01/23/20 0345 01/24/20 0145 01/25/20 0505 01/26/20 0450 01/26/20 1935  AST 48* 50* 24 65* 58*  ALT 39 41 28 43 47*  ALKPHOS 59 67 39 66 63  BILITOT 0.8 0.6 0.3 0.6 0.3  PROT 9.0* 8.7* 5.2* 7.4 7.3  ALBUMIN 3.6  3.4* 2.2* 3.2* 3.1*   No results for input(s): LIPASE, AMYLASE in the last 168 hours. Recent Labs  Lab 01/26/20 1935  AMMONIA 60*    ABG    Component Value Date/Time   PHART 7.357 01/27/2020 1309   PCO2ART 65.0 (H) 01/27/2020 1309   PO2ART 58.0 (L) 01/27/2020 1309   HCO3 36.6 (H) 01/27/2020 1309   TCO2 39 (H) 01/27/2020 1309   O2SAT 88.0 01/27/2020 1309     Coagulation Profile: Recent Labs  Lab 01/26/20 1935  INR 1.2    Cardiac Enzymes: No results for input(s): CKTOTAL, CKMB, CKMBINDEX, TROPONINI in the last 168 hours.  HbA1C: Hgb A1c MFr Bld  Date/Time Value Ref Range Status  01/23/2020 03:45 AM 7.0 (H) 4.8 - 5.6 % Final    Comment:    (NOTE) Pre diabetes:          5.7%-6.4% Diabetes:              >6.4% Glycemic control for   <7.0% adults with diabetes     CBG: Recent Labs  Lab 01/27/20 0737 01/27/20 1159 01/27/20 1548 01/27/20 1951 01/27/20 2351  GLUCAP 203* 198* 228* 196* 193*     Critical care time: 40 minutes    Heber [place], MD [facility] Pager: [phone] Cell: [phone] If no response, call [phone]

## 2020-01-29 ENCOUNTER — Inpatient Hospital Stay (HOSPITAL_COMMUNITY): Payer: Medicare HMO

## 2020-01-29 DIAGNOSIS — Z9911 Dependence on respirator [ventilator] status: Secondary | ICD-10-CM

## 2020-01-29 DIAGNOSIS — E1169 Type 2 diabetes mellitus with other specified complication: Secondary | ICD-10-CM

## 2020-01-29 DIAGNOSIS — E785 Hyperlipidemia, unspecified: Secondary | ICD-10-CM

## 2020-01-29 DIAGNOSIS — N179 Acute kidney failure, unspecified: Secondary | ICD-10-CM

## 2020-01-29 DIAGNOSIS — R579 Shock, unspecified: Secondary | ICD-10-CM

## 2020-01-29 DIAGNOSIS — J9602 Acute respiratory failure with hypercapnia: Secondary | ICD-10-CM

## 2020-01-29 DIAGNOSIS — J181 Lobar pneumonia, unspecified organism: Secondary | ICD-10-CM

## 2020-01-29 DIAGNOSIS — J80 Acute respiratory distress syndrome: Secondary | ICD-10-CM

## 2020-01-29 LAB — COMPREHENSIVE METABOLIC PANEL
ALT: 74 U/L — ABNORMAL HIGH (ref 0–44)
AST: 65 U/L — ABNORMAL HIGH (ref 15–41)
Albumin: 2.7 g/dL — ABNORMAL LOW (ref 3.5–5.0)
Alkaline Phosphatase: 51 U/L (ref 38–126)
Anion gap: 7 (ref 5–15)
BUN: 89 mg/dL — ABNORMAL HIGH (ref 6–20)
CO2: 33 mmol/L — ABNORMAL HIGH (ref 22–32)
Calcium: 8.7 mg/dL — ABNORMAL LOW (ref 8.9–10.3)
Chloride: 118 mmol/L — ABNORMAL HIGH (ref 98–111)
Creatinine, Ser: 2.41 mg/dL — ABNORMAL HIGH (ref 0.61–1.24)
GFR calc Af Amer: 34 mL/min — ABNORMAL LOW (ref >60)
GFR calc non Af Amer: 29 mL/min — ABNORMAL LOW (ref >60)
Glucose, Bld: 288 mg/dL — ABNORMAL HIGH (ref 70–99)
Potassium: 5.1 mmol/L (ref 3.5–5.1)
Sodium: 158 mmol/L — ABNORMAL HIGH (ref 135–145)
Total Bilirubin: 0.7 mg/dL (ref 0.3–1.2)
Total Protein: 6.3 g/dL — ABNORMAL LOW (ref 6.5–8.1)

## 2020-01-29 LAB — CBC WITH DIFFERENTIAL/PLATELET
Abs Immature Granulocytes: 0.95 10*3/uL — ABNORMAL HIGH (ref 0.00–0.07)
Basophils Absolute: 0.1 10*3/uL (ref 0.0–0.1)
Basophils Relative: 0 %
Eosinophils Absolute: 0 10*3/uL (ref 0.0–0.5)
Eosinophils Relative: 0 %
HCT: 43.4 % (ref 39.0–52.0)
Hemoglobin: 12 g/dL — ABNORMAL LOW (ref 13.0–17.0)
Immature Granulocytes: 3 %
Lymphocytes Relative: 7 %
Lymphs Abs: 1.9 10*3/uL (ref 0.7–4.0)
MCH: 29.2 pg (ref 26.0–34.0)
MCHC: 27.6 g/dL — ABNORMAL LOW (ref 30.0–36.0)
MCV: 105.6 fL — ABNORMAL HIGH (ref 80.0–100.0)
Monocytes Absolute: 2.1 10*3/uL — ABNORMAL HIGH (ref 0.1–1.0)
Monocytes Relative: 7 %
Neutro Abs: 23.5 10*3/uL — ABNORMAL HIGH (ref 1.7–7.7)
Neutrophils Relative %: 83 %
Platelets: 321 10*3/uL (ref 150–400)
RBC: 4.11 MIL/uL — ABNORMAL LOW (ref 4.22–5.81)
RDW: 14.2 % (ref 11.5–15.5)
WBC: 28.5 10*3/uL — ABNORMAL HIGH (ref 4.0–10.5)
nRBC: 0.1 % (ref 0.0–0.2)

## 2020-01-29 LAB — GLUCOSE, CAPILLARY
Glucose-Capillary: 229 mg/dL — ABNORMAL HIGH (ref 70–99)
Glucose-Capillary: 274 mg/dL — ABNORMAL HIGH (ref 70–99)
Glucose-Capillary: 277 mg/dL — ABNORMAL HIGH (ref 70–99)
Glucose-Capillary: 278 mg/dL — ABNORMAL HIGH (ref 70–99)
Glucose-Capillary: 280 mg/dL — ABNORMAL HIGH (ref 70–99)
Glucose-Capillary: 353 mg/dL — ABNORMAL HIGH (ref 70–99)
Glucose-Capillary: 382 mg/dL — ABNORMAL HIGH (ref 70–99)

## 2020-01-29 LAB — CK: Total CK: 1270 U/L — ABNORMAL HIGH (ref 49–397)

## 2020-01-29 LAB — URINALYSIS, ROUTINE W REFLEX MICROSCOPIC
Bilirubin Urine: NEGATIVE
Glucose, UA: >=500 mg/dL — AB
Ketones, ur: NEGATIVE mg/dL
Nitrite: NEGATIVE
Protein, ur: 100 mg/dL — AB
RBC / HPF: >50 RBC/hpf — ABNORMAL HIGH (ref 0–5)
Specific Gravity, Urine: 1.016 (ref 1.005–1.030)
WBC, UA: >50 WBC/hpf — ABNORMAL HIGH (ref 0–5)
pH: 6 (ref 5.0–8.0)

## 2020-01-29 LAB — BASIC METABOLIC PANEL
Anion gap: 5 (ref 5–15)
BUN: 90 mg/dL — ABNORMAL HIGH (ref 6–20)
CO2: 33 mmol/L — ABNORMAL HIGH (ref 22–32)
Calcium: 8.3 mg/dL — ABNORMAL LOW (ref 8.9–10.3)
Chloride: 116 mmol/L — ABNORMAL HIGH (ref 98–111)
Creatinine, Ser: 2.41 mg/dL — ABNORMAL HIGH (ref 0.61–1.24)
GFR calc Af Amer: 34 mL/min — ABNORMAL LOW (ref >60)
GFR calc non Af Amer: 29 mL/min — ABNORMAL LOW (ref >60)
Glucose, Bld: 339 mg/dL — ABNORMAL HIGH (ref 70–99)
Potassium: 5.5 mmol/L — ABNORMAL HIGH (ref 3.5–5.1)
Sodium: 154 mmol/L — ABNORMAL HIGH (ref 135–145)

## 2020-01-29 LAB — LACTIC ACID, PLASMA: Lactic Acid, Venous: 2 mmol/L (ref 0.5–1.9)

## 2020-01-29 MED ORDER — INSULIN ASPART 100 UNIT/ML ~~LOC~~ SOLN
6.0000 [IU] | SUBCUTANEOUS | Status: DC
  Administered 2020-01-29: 6 [IU] via SUBCUTANEOUS

## 2020-01-29 MED ORDER — SODIUM CHLORIDE 0.9 % IV SOLN
2.0000 g | Freq: Two times a day (BID) | INTRAVENOUS | Status: DC
  Administered 2020-01-29 – 2020-01-30 (×2): 2 g via INTRAVENOUS
  Filled 2020-01-29 (×2): qty 2

## 2020-01-29 MED ORDER — FREE WATER
250.0000 mL | Status: DC
  Administered 2020-01-29 – 2020-02-01 (×37): 250 mL

## 2020-01-29 MED ORDER — SODIUM ZIRCONIUM CYCLOSILICATE 5 G PO PACK
5.0000 g | PACK | Freq: Two times a day (BID) | ORAL | Status: DC
  Administered 2020-01-29 – 2020-01-31 (×5): 5 g via ORAL
  Filled 2020-01-29 (×6): qty 1

## 2020-01-29 MED ORDER — PHENYLEPHRINE CONCENTRATED 100MG/250ML (0.4 MG/ML) INFUSION SIMPLE
0.0000 ug/min | INTRAVENOUS | Status: DC
  Administered 2020-01-29: 220 ug/min via INTRAVENOUS
  Administered 2020-01-29: 240 ug/min via INTRAVENOUS
  Administered 2020-01-29: 250 ug/min via INTRAVENOUS
  Administered 2020-01-30 (×2): 200 ug/min via INTRAVENOUS
  Administered 2020-01-31 (×2): 160 ug/min via INTRAVENOUS
  Administered 2020-01-31: 240 ug/min via INTRAVENOUS
  Administered 2020-02-01 (×2): 300 ug/min via INTRAVENOUS
  Administered 2020-02-01: 240 ug/min via INTRAVENOUS
  Filled 2020-01-29 (×11): qty 250

## 2020-01-29 MED ORDER — VITAL 1.5 CAL PO LIQD
1000.0000 mL | ORAL | Status: DC
  Administered 2020-01-29 – 2020-01-31 (×3): 1000 mL

## 2020-01-29 MED ORDER — NOREPINEPHRINE 16 MG/250ML-% IV SOLN
0.0000 ug/min | INTRAVENOUS | Status: DC
  Administered 2020-01-29: 2 ug/min via INTRAVENOUS
  Administered 2020-01-31: 10 ug/min via INTRAVENOUS
  Administered 2020-02-01: 04:00:00 2 ug/min via INTRAVENOUS
  Filled 2020-01-29 (×3): qty 250

## 2020-01-29 MED ORDER — NYSTATIN 100000 UNIT/ML MT SUSP
5.0000 mL | Freq: Four times a day (QID) | OROMUCOSAL | Status: DC
  Administered 2020-01-29 – 2020-02-01 (×13): 500000 [IU] via ORAL
  Filled 2020-01-29 (×18): qty 5

## 2020-01-29 MED ORDER — INSULIN GLARGINE 100 UNIT/ML ~~LOC~~ SOLN
10.0000 [IU] | Freq: Every day | SUBCUTANEOUS | Status: DC
  Administered 2020-01-29 – 2020-01-31 (×3): 10 [IU] via SUBCUTANEOUS
  Filled 2020-01-29 (×3): qty 0.1

## 2020-01-29 MED ORDER — SODIUM CHLORIDE 0.9% FLUSH: 10.0000 mL | INTRAVENOUS | Status: DC | PRN

## 2020-01-29 MED ORDER — SODIUM CHLORIDE 0.9% FLUSH
10.0000 mL | Freq: Two times a day (BID) | INTRAVENOUS | Status: DC
  Administered 2020-01-29 – 2020-02-01 (×6): 10 mL

## 2020-01-29 MED ORDER — NOREPINEPHRINE 4 MG/250ML-% IV SOLN
0.0000 ug/min | INTRAVENOUS | Status: DC
  Filled 2020-01-29: qty 250

## 2020-01-29 MED ORDER — INSULIN ASPART 100 UNIT/ML ~~LOC~~ SOLN
10.0000 [IU] | SUBCUTANEOUS | Status: DC
  Administered 2020-01-29 – 2020-01-30 (×5): 10 [IU] via SUBCUTANEOUS

## 2020-01-29 MED FILL — Sodium Chloride IV Soln 0.9%: INTRAVENOUS | Qty: 250 | Status: AC

## 2020-01-29 MED FILL — Fentanyl Citrate Preservative Free (PF) Inj 2500 MCG/50ML: INTRAMUSCULAR | Qty: 50 | Status: AC

## 2020-01-29 NOTE — Progress Notes (Signed)
NAMEHyden Hampton, MRN:  [id number], DOB:  1966-12-17, LOS: 8 ADMISSION DATE:  Jan 25, 2020, CONSULTATION DATE:  2/4 REFERRING MD:  Dr. Jarvis Newcomer, CHIEF COMPLAINT:  COVID 19 pneumonia   Brief History    54 y/o with admitted on 2/2 with severe COVID 19 pneumonia. Intubated on 2/5, placed in prone position. Bradycardic cardiac arrest on 2/7.   Past Medical History  DM2 HTN HLD Obesity  Significant Hospital Events   2/2 Admit 2/5 Intubated 2/6 Proned, paralyzed 2/7 Developed hematuria, bradycardic event, near arrest 2/8 ETT exchanged  Consults:  PCCM  Procedures:  OETT 2/5 >> Lt IJ CVL 2/5 >> Rt radial A line 2/6 >>  Significant Diagnostic Tests:  TTE 2/9-LV function not able to be assessed, moderate LVH.  Normal LA, RV size and function, RA.  Normal RV size with greater than 50% variability.  Trace TR, otherwise normal valves.  Micro Data:  Blood cultures 2/2-negative Blood cultures 2/9>> Trach aspirate 2/9>> moderate PMNs, rare yeast, rare GPC MRSA nares 2/8- negative  Antimicrobials:  Convalescent plasma 2/2 Tocilizumab 2/2 Azithromycin 2/2 >> 2/5  Ceftriaxone 2/2 >> 2/6 Remdesivir 2/2 >> 2/6  Solu-Medrol 2/2 >   Interim history/subjective:   Continues high vent requirements-not tolerating any downward titration of support.  Required Levophed to be restarted.  Still on Neo-Synephrine and vasopressin.  Nurses concerned for oral thrush with a film in his mouth.  Urine mostly clear-no blood clots.  Tube feeds at goal.  Objective   Blood pressure (!) 92/53, pulse (!) 120, temperature (!) 100.8 F (38.2 C), resp. rate (!) 28, height 5\' 10"  (1.778 m), weight (!) 179 kg, SpO2 (!) 88 %. CVP:  [16 mmHg-26 mmHg] 16 mmHg  Vent Mode: PRVC FiO2 (%):  [90 %] 90 % Set Rate:  [35 bmp] 35 bmp Vt Set:  [430 mL] 430 mL PEEP:  [14 cmH20-16 cmH20] 16 cmH20 Plateau Pressure:  [28 cmH20-31 cmH20] 28 cmH20   Intake/Output Summary (Last 24 hours) at 01/29/2020 1421 Last data  filed at 01/29/2020 0900 Gross per 24 hour  Intake 3016.04 ml  Output 1390 ml  Net 1626.04 ml   Filed Weights   01/27/20 0500 01/28/20 0500 01/29/20 0500  Weight: (!) 187 kg (!) 175 kg (!) 179 kg    Examination:  General: Critically ill obese man lying in bed intubated, heavily sedated HENT: [place]/AT, eyes anicteric, ETT in place.  Oral mucosa moist PULM: Distant breath sounds, clear to auscultation bilaterally, breathing synchronously with the ventilator CV: Minimally tachycardic, no murmurs, regular rhythm GI: Obese, soft, nontender, nondistended GU: Foley in place draining yellow urine MSK: Mild peripheral edema, no clubbing or cyanosis Neuro: RASS -5 Derm: No rashes or wounds, very warm skin   2/10 CXR personally reviewed>> diffuse bilateral infiltrates.  Possibly air bronchograms in the retrocardiac left lower lobe, left upper lobe.  ETT 5.5 cm above the carina.  LIJ CVC.   Resolved Hospital Problem list     Assessment & Plan:  COVID 19 pneumonia ARDS Compensated hypercarbia> likely severe dead space from ARDS Likely acute lobar pneumonia on the left -Continue low tidal volume ventilation, 48 cc/kg ideal body weight with goal plateau less than 30 and driving pressure less than 15.  Titrate PEEP and FiO2 per ARDS protocol. -No future proning given near arrest with previous episode. -Given increased vasopressor requirements, limited in ability to diurese.  Goal net negative fluid balance. -Intermittent and NMB for vent dyssynchrony  Need for sedation -Goal RASS -4  to -5 to tolerate vent synchrony -Intermittent neuromuscular blockade as required -Continue Dilaudid and Versed infusions with enteral oxycodone and clonazepam.  Sinus tachycardia> likely due to fevers and high metabolic demands Cardiac arrest, bradycardia, in prone position on 2/7 CVP 21.  No evidence of cardiomyopathy on echocardiogram. -Cooling blanket, acetaminophen as needed for fevers -Continue  antibiotics -Continue aggressive sedation strategy  DM2 with hyperglycemia-remains uncontrolled -Continue linagliptin -Continue aspart scheduled plus sliding scale  Hematuria > resolved. Unknown etiology. -Doing well without bladder irrigation -Continue to monitor for bleeding -Daily CBC  AKI> improved, subsequently worsened again -Continue monitor -Strict I's/O -Renally dose meds and avoid nephrotoxic meds  Hyperkalemia -Lokelma BID -Continue to monitor -Recommend switching to renal tube feed  Best practice:  Diet: tube feeding Pain/Anxiety/Delirium protocol (if indicated): Ordered VAP protocol (if indicated): yes DVT prophylaxis: Lovenox GI prophylaxis: Pantoprazole for stress ulcer prophylaxis Glucose control: SSI, linagliptin Mobility: bed rest Code Status: full Family Communication: I spoke to both sisters, Gregory Hampton and Gregory Hampton Disposition:  ICU  Labs   CBC: Recent Labs  Lab 01/25/20 0505 01/25/20 0505 01/26/20 0450 01/26/20 0452 01/26/20 1527 01/26/20 1527 01/26/20 1935 01/26/20 1935 01/27/20 0445 01/27/20 0445 01/27/20 0537 01/27/20 1309 01/28/20 0415 01/28/20 0924 01/29/20 0612  WBC 23.4*   < > 23.3*   < > 26.7*  --  29.3*  --  30.0*  --   --   --  27.7*  --  28.5*  NEUTROABS 19.3*  --  18.7*  --   --   --  24.4*  --   --   --   --   --  21.9*  --  23.5*  HGB 12.4*   < > 13.3   < > 12.9*   < > 13.6   < > 13.3   < > 14.3 15.0 13.4 14.3 12.0*  HCT 41.1   < > 44.1   < > 41.7   < > 44.6   < > 44.4   < > 42.0 44.0 45.9 42.0 43.4  MCV 97.2   < > 99.8   < > 97.4  --  98.0  --  99.1  --   --   --  100.4*  --  105.6*  PLT 372   < > 343   < > 362  --  399  --  437*  --   --   --  401*  --  321   < > = values in this interval not displayed.    Basic Metabolic Panel: Recent Labs  Lab 01/26/20 0450 01/26/20 0452 01/26/20 1935 01/26/20 1935 01/27/20 0445 01/27/20 0537 01/27/20 1309 01/28/20 0415 01/28/20 0924 01/29/20 0612 01/29/20 1305  NA 142   < >  142   < > 145   < > 149* 152* 154* 158* 154*  K 4.9   < > 4.6   < > 4.6   < > 4.8 4.7 4.8 5.1 5.5*  CL 101   < > 99  --  104  --   --  109  --  118* 116*  CO2 29   < > 26  --  30  --   --  34*  --  33* 33*  GLUCOSE 181*   < > 211*  --  235*  --   --  258*  --  288* 339*  BUN 90*   < > 97*  --  91*  --   --  73*  --  89* 90*  CREATININE 3.14*   < > 3.23*  --  2.34*  --   --  1.71*  --  2.41* 2.41*  CALCIUM 8.6*   < > 8.5*  --  8.9  --   --  9.3  --  8.7* 8.3*  MG 3.1*  --  3.2*  --  3.3*  --   --   --   --   --   --   PHOS 9.5*  --  8.4*  --  5.9*  --   --   --   --   --   --    < > = values in this interval not displayed.   GFR: Estimated Creatinine Clearance: 57.2 mL/min (A) (by C-G formula based on SCr of 2.41 mg/dL (H)). Recent Labs  Lab 01/26/20 1935 01/27/20 0445 01/28/20 0415 01/28/20 1655 01/28/20 2125 01/29/20 0612  WBC 29.3* 30.0* 27.7*  --   --  28.5*  LATICACIDVEN 1.6  --   --  2.8* 2.2*  --     Liver Function Tests: Recent Labs  Lab 01/24/20 0145 01/25/20 0505 01/26/20 0450 01/26/20 1935 01/29/20 0612  AST 50* 24 65* 58* 65*  ALT 41 28 43 47* 74*  ALKPHOS 67 39 66 63 51  BILITOT 0.6 0.3 0.6 0.3 0.7  PROT 8.7* 5.2* 7.4 7.3 6.3*  ALBUMIN 3.4* 2.2* 3.2* 3.1* 2.7*   No results for input(s): LIPASE, AMYLASE in the last 168 hours. Recent Labs  Lab 01/26/20 1935  AMMONIA 60*    ABG    Component Value Date/Time   PHART 7.301 (L) 01/28/2020 0924   PCO2ART 76.6 (HH) 01/28/2020 0924   PO2ART 65.0 (L) 01/28/2020 0924   HCO3 37.5 (H) 01/28/2020 0924   TCO2 40 (H) 01/28/2020 0924   O2SAT 88.0 01/28/2020 0924     Coagulation Profile: Recent Labs  Lab 01/26/20 1935  INR 1.2    Cardiac Enzymes: Recent Labs  Lab 01/29/20 1305  CKTOTAL 1,270*    HbA1C: Hgb A1c MFr Bld  Date/Time Value Ref Range Status  01/23/2020 03:45 AM 7.0 (H) 4.8 - 5.6 % Final    Comment:    (NOTE) Pre diabetes:          5.7%-6.4% Diabetes:              >6.4% Glycemic  control for   <7.0% adults with diabetes     CBG: Recent Labs  Lab 01/28/20 1959 01/28/20 2348 01/29/20 0342 01/29/20 0725 01/29/20 1231  GLUCAP 297* 229* 274* 277* 278*     This patient is critically ill with multiple organ system failure which requires frequent high complexity decision making, assessment, support, evaluation, and titration of therapies. This was completed through the application of advanced monitoring technologies and extensive interpretation of multiple databases. During this encounter critical care time was devoted to patient care services described in this note for 50 minutes.  Steffanie Dunn, DO 01/29/20 2:55 PM [facility]

## 2020-01-29 NOTE — Progress Notes (Signed)
Pharmacy Antibiotic Note  Gregory Hampton is a 54 y.o. male admitted on 02/15/2020 with COVID-19 pneumonia.  Pharmacy has been consulted for cefepime dosing for HCAP. SCr increased to 2.4.  Tmax 103.3, WBC inc to 28  Plan: Decrease to Cefepime 2g IV q12h Follow up renal function, culture results, and clinical course.   Height: 5\' 10"  (177.8 cm) Weight: (!) 394 lb 10 oz (179 kg) IBW/kg (Calculated) : 73  Temp (24hrs), Avg:101.3 F (38.5 C), Min:99.7 F (37.6 C), Max:102.7 F (39.3 C)  Recent Labs  Lab 01/26/20 1527 01/26/20 1935 01/27/20 0445 01/28/20 0415 01/28/20 1655 01/28/20 2125 01/29/20 0612  WBC 26.7* 29.3* 30.0* 27.7*  --   --  28.5*  CREATININE 3.61* 3.23* 2.34* 1.71*  --   --  2.41*  LATICACIDVEN  --  1.6  --   --  2.8* 2.2*  --     Estimated Creatinine Clearance: 57.2 mL/min (A) (by C-G formula based on SCr of 2.41 mg/dL (H)).    Allergies  Allergen Reactions  . Mushroom Extract Complex Rash    Antimicrobials this admission: CTX  2/2 >>2/6 Azith 2/2 >> 2/6 Remdes 2/2 >> 2/6 Tocilizumab 2/2  Cefepime 2/9 >>   Dose adjustments this admission:  Microbiology results: 2/2 Covid + 2/2 BCx: NGF 2/8 MRSA PCR: negative 2/9 BCx: ngtd 2/9 TA: rare yeast, rare GPC.  Cxt pending.  Thank you for allowing pharmacy to be a part of this patient's care.  4/9 PharmD, BCPS Clinical pharmacist phone 7am- 5pm: [phone] 01/29/2020 8:20 AM

## 2020-01-29 NOTE — H&P (Addendum)
H&P was initially written under progress note type of note, changed to H&P   History and Physical    Gregory Hampton TKW:[id number] DOB: March 03, 1966 DOA: 02/15/2020  PCP: Quitman Livings, MD  Patient coming from: home  I have personally briefly reviewed patient's old medical records in [facility]  Chief Complaint: shortness of breath  HPI: Gregory Hampton is Gregory Hampton 54 y.o. male with medical history significant of T2DM, HTN, obesity, HLD presenting with shortness of breath.  Symptoms started last Friday.  Was prescribed abx, steroids.  He presented again on Monday to his PCP and was hypoxic.  His PCP recommended he go to the ED, but he declined and went home and used his mothers oxygen.  He had persistent SOB and presented to the ED with O2 sats in the 60's on RA.  He notes pleuritic CP, SOB, fever.  Denies change in taste/smell, diarrhea, abdominal symptoms.  He denies appetite.  Denies smoking or etoh use.       ED Course: Labs, CXR, EKG, steroids, remdesivir, abx.   Review of Systems: As per HPI otherwise 10 point review of systems negative.   Past Medical History:  Diagnosis Date  . Diabetes mellitus without complication (HCC)   . High cholesterol   . Hypertension   . Obesity     Past Surgical History:  Procedure Laterality Date  . arm surgery    . KNEE ARTHROCENTESIS    . TONSILLECTOMY       reports that he has never smoked. He has never used smokeless tobacco. He reports previous alcohol use. He reports previous drug use.  Allergies  Allergen Reactions  . Mushroom Extract Complex Rash    History reviewed. No pertinent family history.  Prior to Admission medications   Medication Sig Start Date End Date Taking? Authorizing Provider  HYDROcodone-acetaminophen (NORCO) 10-325 MG tablet Take 1 tablet by mouth 4 (four) times daily as needed for severe pain.    Yes [provider]  sulfamethoxazole-trimethoprim (BACTRIM DS) 800-160 MG tablet Take 1 tablet by mouth  2 (two) times daily. 01/17/20 01/26/20 Yes [provider]  atenolol (TENORMIN) 25 MG tablet Take by mouth daily.    [provider]  citalopram (CELEXA) 20 MG tablet Take 20 mg by mouth daily.    [provider]  cyclobenzaprine (FLEXERIL) 10 MG tablet Take 1 tablet (10 mg total) by mouth 2 (two) times daily as needed for muscle spasms. 08/28/18   Mortis, Jerrel Ivory I, PA-C  gabapentin (NEURONTIN) 600 MG tablet Take 600 mg by mouth 3 (three) times daily.    [provider]  hydrochlorothiazide (HYDRODIURIL) 25 MG tablet Take 25 mg by mouth daily.    [provider]  HYDROcodone-acetaminophen (NORCO/VICODIN) 5-325 MG tablet Take 1 tablet by mouth 4 (four) times daily as needed for moderate pain.    [provider]  lisinopril (PRINIVIL,ZESTRIL) 5 MG tablet Take 5 mg by mouth daily.    [provider]  lovastatin (MEVACOR) 20 MG tablet Take 20 mg by mouth at bedtime.    [provider]  metFORMIN (GLUCOPHAGE) 500 MG tablet Take by mouth 2 (two) times daily with Raymon Schlarb meal.    [provider]  phentermine 37.5 MG capsule Take 37.5 mg by mouth every morning.    [provider]  traMADol (ULTRAM) 50 MG tablet Take by mouth every 6 (six) hours as needed.    [provider]    Physical Exam: Vitals:   01/29/20 1830  01/29/20 1900 01/29/20 1915 01/29/20 1930  BP:  111/69 111/65 106/67  Pulse:      Resp: (!) 35 (!) 35 (!) 25 (!) 35  Temp: 98.2 F (36.8 C) 97.9 F (36.6 C) 97.7 F (36.5 C) 97.9 F (36.6 C)  TempSrc:      SpO2: 92% 91% 92% 93%  Weight:      Height:        Constitutional: NAD, calm, comfortable Vitals:   01/29/20 1830 01/29/20 1900 01/29/20 1915 01/29/20 1930  BP:  111/69 111/65 106/67  Pulse:      Resp: (!) 35 (!) 35 (!) 25 (!) 35  Temp: 98.2 F (36.8 C) 97.9 F (36.6 C) 97.7 F (36.5 C) 97.9 F (36.6 C)  TempSrc:      SpO2: 92% 91% 92% 93%  Weight:      Height:       Eyes:  PERRL, lids and conjunctivae normal ENMT: Mucous membranes are moist. Posterior pharynx clear of any exudate or lesions.Normal dentition.  Neck: normal, supple, no masses, no thyromegaly Respiratory: tachypneic on 15 L [place], no adventitious lung sounds appreciated Cardiovascular: Regular rate and rhythm, no murmurs / rubs / gallops. No extremity edema  Abdomen: no tenderness, no masses palpated. No hepatosplenomegaly. Bowel sounds positive.  Musculoskeletal: no clubbing / cyanosis. No joint deformity upper and lower extremities. Good ROM, no contractures. Normal muscle tone.  Skin: no rashes, lesions, ulcers. No induration Neurologic: CN 2-12 grossly intact. Sensation intact.  Moving all extremities.  Psychiatric: Normal judgment and insight. Alert and oriented x 3. Normal mood.   Labs on Admission: I have personally reviewed following labs and imaging studies  CBC: Recent Labs  Lab 01/25/20 0505 01/25/20 0505 01/26/20 0450 01/26/20 0452 01/26/20 1527 01/26/20 1527 01/26/20 1935 01/26/20 1935 01/27/20 0445 01/27/20 0445 01/27/20 0537 01/27/20 1309 01/28/20 0415 01/28/20 0924 01/29/20 0612  WBC 23.4*   < > 23.3*   < > 26.7*  --  29.3*  --  30.0*  --   --   --  27.7*  --  28.5*  NEUTROABS 19.3*  --  18.7*  --   --   --  24.4*  --   --   --   --   --  21.9*  --  23.5*  HGB 12.4*   < > 13.3   < > 12.9*   < > 13.6   < > 13.3   < > 14.3 15.0 13.4 14.3 12.0*  HCT 41.1   < > 44.1   < > 41.7   < > 44.6   < > 44.4   < > 42.0 44.0 45.9 42.0 43.4  MCV 97.2   < > 99.8   < > 97.4  --  98.0  --  99.1  --   --   --  100.4*  --  105.6*  PLT 372   < > 343   < > 362  --  399  --  437*  --   --   --  401*  --  321   < > = values in this interval not displayed.   Basic Metabolic Panel: Recent Labs  Lab 01/26/20 0450 01/26/20 0452 01/26/20 1935 01/26/20 1935 01/27/20 0445 01/27/20 0537 01/27/20 1309 01/28/20 0415 01/28/20 0924 01/29/20 0612 01/29/20 1305  NA 142   < > 142   < > 145   < >  149* 152* 154* 158* 154*  K 4.9   < >  4.6   < > 4.6   < > 4.8 4.7 4.8 5.1 5.5*  CL 101   < > 99  --  104  --   --  109  --  118* 116*  CO2 29   < > 26  --  30  --   --  34*  --  33* 33*  GLUCOSE 181*   < > 211*  --  235*  --   --  258*  --  288* 339*  BUN 90*   < > 97*  --  91*  --   --  73*  --  89* 90*  CREATININE 3.14*   < > 3.23*  --  2.34*  --   --  1.71*  --  2.41* 2.41*  CALCIUM 8.6*   < > 8.5*  --  8.9  --   --  9.3  --  8.7* 8.3*  MG 3.1*  --  3.2*  --  3.3*  --   --   --   --   --   --   PHOS 9.5*  --  8.4*  --  5.9*  --   --   --   --   --   --    < > = values in this interval not displayed.   GFR: Estimated Creatinine Clearance: 57.2 mL/min (Abriella Filkins) (by C-G formula based on SCr of 2.41 mg/dL (H)). Liver Function Tests: Recent Labs  Lab 01/24/20 0145 01/25/20 0505 01/26/20 0450 01/26/20 1935 01/29/20 0612  AST 50* 24 65* 58* 65*  ALT 41 28 43 47* 74*  ALKPHOS 67 39 66 63 51  BILITOT 0.6 0.3 0.6 0.3 0.7  PROT 8.7* 5.2* 7.4 7.3 6.3*  ALBUMIN 3.4* 2.2* 3.2* 3.1* 2.7*   No results for input(s): LIPASE, AMYLASE in the last 168 hours. Recent Labs  Lab 01/26/20 1935  AMMONIA 60*   Coagulation Profile: Recent Labs  Lab 01/26/20 1935  INR 1.2   Cardiac Enzymes: Recent Labs  Lab 01/29/20 1305  CKTOTAL 1,270*   BNP (last 3 results) No results for input(s): PROBNP in the last 8760 hours. HbA1C: No results for input(s): HGBA1C in the last 72 hours. CBG: Recent Labs  Lab 01/29/20 0342 01/29/20 0725 01/29/20 1231 01/29/20 1701 01/29/20 1933  GLUCAP 274* 277* 278* 353* 382*   Lipid Profile: Recent Labs    01/27/20 0455 01/28/20 0415  TRIG 538* 531*   Thyroid Function Tests: No results for input(s): TSH, T4TOTAL, FREET4, T3FREE, THYROIDAB in the last 72 hours. Anemia Panel: No results for input(s): VITAMINB12, FOLATE, FERRITIN, TIBC, IRON, RETICCTPCT in the last 72 hours. Urine analysis: No results found for: COLORURINE, APPEARANCEUR, LABSPEC, PHURINE,  GLUCOSEU, HGBUR, BILIRUBINUR, KETONESUR, PROTEINUR, UROBILINOGEN, NITRITE, LEUKOCYTESUR  Radiological Exams on Admission: DG Chest Port 1 View  Result Date: 01/29/2020 CLINICAL DATA:  Acute respiratory failure with hypoxemia. EXAM: PORTABLE CHEST 1 VIEW COMPARISON:  01/28/2020 FINDINGS: Patient is slightly rotated to the left. Enteric tube courses into the region of the stomach and off the film as tip is not visualized. Endotracheal tube has tip 5.5 cm above the carina. Left IJ central venous catheter unchanged. Lungs are adequately inflated with hazy bilateral airspace opacification with possible slight improved aeration over the right lung. Possible small amount of left pleural fluid unchanged. Cardiomediastinal silhouette and remainder of the exam is unchanged. IMPRESSION: 1. Stable hazy bilateral airspace process which may be due to interstitial edema or infection. Possible small amount  left pleural fluid unchanged. 2.  Tubes and lines as described. Electronically Signed   By: Elberta Fortis M.D.   On: 01/29/2020 09:08   DG Chest Port 1 View  Result Date: 01/28/2020 CLINICAL DATA:  ARDS EXAM: PORTABLE CHEST 1 VIEW COMPARISON:  Portable exam 0552 hours compared to 01/27/2020 FINDINGS: Tip of endotracheal tube projects 4.0 cm above carina. Nasogastric tube extends into stomach. LEFT jugular central venous catheter with tip projecting over SVC. Slightly rotated to the LEFT. Stable heart size. Persistent diffuse infiltrates RIGHT greater than LEFT, could represent multifocal pneumonia, pulmonary edema or ARDS. No gross pleural effusion or pneumothorax. IMPRESSION: Persistent BILATERAL pulmonary infiltrates RIGHT greater than LEFT question multifocal pneumonia, pulmonary edema or ARDS. Electronically Signed   By: Ulyses Southward M.D.   On: 01/28/2020 09:25   ECHOCARDIOGRAM COMPLETE  Result Date: 01/28/2020    ECHOCARDIOGRAM REPORT   Patient Name:   DONI WIDMER Date of Exam: 01/28/2020 Medical Rec #:   [id number]        Height:       70.0 in Accession #:    [id number]       Weight:       385.8 lb Date of Birth:  [birth year]       BSA:          2.76 m Patient Age:    53 years         BP:           108/57 mmHg Patient Gender: M                HR:           135 bpm. Exam Location:  Inpatient Procedure: 2D Echo, Color Doppler and Cardiac Doppler Indications:     R06.9 DOE  History:         Patient has no prior history of Echocardiogram examinations.                  Risk Factors:Hypertension, Diabetes and Dyslipidemia. COVID+                  02/03/2020.  Sonographer:     Irving Burton Senior RDCS Referring Phys:  Humberto Seals Diagnosing Phys: Armanda Magic MD IMPRESSIONS  1. Cannot assess LV function due to tachycardia and lack of endocardial borders for assessment of wall motio. Left ventrical is not well visualized. There is moderately increased left ventricular hypertrophy. Left ventricular diastolic parameters are indeterminate. Not assessed.  2. Right ventricular systolic function is normal. The right ventricular size is normal.  3. No evidence of mitral valve regurgitation.  4. The aortic valve is normal in structure and function. Aortic valve regurgitation is not visualized. No aortic stenosis is present.  5. The inferior vena cava is normal in size with greater than 50% respiratory variability, suggesting right atrial pressure of 3 mmHg.  6. Recommend repeat echo with definity contrast. FINDINGS  Left Ventricle: Cannot assess due to tachycardia and lack of endocardial borders for assessment of wall motion. The left ventricle is not well visualized. The left ventricular internal cavity size was normal in size. There is moderately increased left ventricular hypertrophy. Concentric left ventricular hypertrophy. Left ventricular diastolic parameters are indeterminate. Right Ventricle: The right ventricular size is normal. No increase in right ventricular wall thickness. Right ventricular systolic function is normal.  Left Atrium: Left atrial size was normal in size. Right Atrium: Right atrial size was normal in size. Pericardium: There is no  evidence of pericardial effusion. Mitral Valve: The mitral valve is normal in structure and function. Normal mobility of the mitral valve leaflets. No evidence of mitral valve regurgitation. No evidence of mitral valve stenosis. Tricuspid Valve: The tricuspid valve is normal in structure. Tricuspid valve regurgitation is trivial. No evidence of tricuspid stenosis. Aortic Valve: The aortic valve is normal in structure and function. Aortic valve regurgitation is not visualized. No aortic stenosis is present. Pulmonic Valve: The pulmonic valve was normal in structure. Pulmonic valve regurgitation is not visualized. No evidence of pulmonic stenosis. Aorta: The aortic root is normal in size and structure. Venous: The inferior vena cava is normal in size with greater than 50% respiratory variability, suggesting right atrial pressure of 3 mmHg. The inferior vena cava and the hepatic vein show Tyrik Stetzer normal flow pattern. IAS/Shunts: No atrial level shunt detected by color flow Doppler.  LEFT VENTRICLE PLAX 2D LVIDd:         3.98 cm LVIDs:         2.04 cm LV PW:         1.35 cm LV IVS:        1.30 cm LVOT diam:     2.30 cm LV SV:         84.76 ml LV SV Index:   18.34 LVOT Area:     4.15 cm  RIGHT VENTRICLE TAPSE (M-mode): 1.8 cm LEFT ATRIUM             Index       RIGHT ATRIUM           Index LA diam:        2.60 cm 0.94 cm/m  RA Area:     14.00 cm LA Vol (A2C):   56.9 ml 20.62 ml/m RA Volume:   35.15 ml  12.74 ml/m LA Vol (A4C):   69.5 ml 25.18 ml/m LA Biplane Vol: 64.2 ml 23.26 ml/m  AORTIC VALVE LVOT Vmax:   151.00 cm/s LVOT Vmean:  108.000 cm/s LVOT VTI:    0.204 m  AORTA Ao Root diam: 3.10 cm MITRAL VALVE MV Area (PHT): 4.57 cm              SHUNTS MV Decel Time: 166 msec              Systemic VTI:  0.20 m MV E velocity: 81.10 cm/s  103 cm/s  Systemic Diam: 2.30 cm MV Amen Dargis velocity: 127.00 cm/s  70.3 cm/s MV E/Marine Lezotte ratio:  0.64        1.5 Fransico Him MD Electronically signed by Fransico Him MD Signature Date/Time: 01/28/2020/3:00:53 PM    Final (Updated)     EKG: Independently reviewed. Sinus tachy, RBBB, no priors for comparison  Assessment/Plan Active Problems:   Acute respiratory failure due to COVID-19 [facility])   Pneumonia due to COVID-19 virus   Diabetes mellitus without complication ([place])   Acute respiratory failure ([place])   Hypertension   Type 2 diabetes mellitus with hyperlipidemia ([place])   Morbid obesity with BMI of 60.0-69.9, adult (HCC)   Pressure injury of skin   Acute Hypoxic Respiratory Failure 2/2 COVID 19 Pneumonia Currently on 15 L HFNC and NRB Steroids, remdesivir Discussed risk/benefits, off label use of actemra.  Discussed unclear evidence.  No hx TB or hepatitis.  He was agreeable after discussion of risks/benefitis.  Ordered 2/2.  Convalescent plasma ordered 2/2 after discussion of risks/benefits.  Pt looked over and signed consent form. Procalcitonin is 0.5, will continue abx  for possible bacterial pneumonia, though suspect this is unlikely  I/O, daily weights IS, OOB, prone as able CRP significantly elevated, follow  Type 2 Diabetes: metformin at home.  Follow A1c.  SSI for now, follow closely.  Hypertension: continue atenolol.  Hold lisinopril and HCTZ for now, consider resumption tomorrow.  HLD: continue statin  Depression  Anxiety: continue celexa  DVT prophylaxis: BID lovenox given critical illness Code Status: full  Family Communication: none at bedside Disposition Plan: pending further improvemetn Consults called: none  Admission status: inpatient  50 min critical care time given AHRF 2/2 COVID 19 with significant hypoxia requiring 15 L [place] + NRB  Lacretia Nicksaldwell Powell MD [facility] Pager AMION  If 7PM-7AM, please contact night-coverage [email or web address] Password [email or web address]  01/29/2020, 8:06 PM

## 2020-01-29 NOTE — Progress Notes (Addendum)
Nutrition Follow up   DOCUMENTATION CODES:   Morbid obesity  INTERVENTION:   -Recommend Cortrak placement once bleeding improves -No BM x 5 days, regimen in place  Tube feeding:  -Vital 1.5 @ 50 ml/hr via OGT (1200 ml) -60 ml Prostat TID -Free water flushes 250 ml Q2 hours- per CCM  Provides: 2400 kcals, 171 grams protein, 917 ml free water (3917 ml with flushes).  NUTRITION DIAGNOSIS:   Increased nutrient needs related to acute illness(COVID 19 PNA) as evidenced by estimated needs.  Ongoing   GOAL:   Patient will meet greater than or equal to 90% of their needs   Addressed via TF   MONITOR:   Diet advancement, Vent status, Skin, TF tolerance, Weight trends, Labs, I & O's  REASON FOR ASSESSMENT:   Consult Enteral/tube feeding initiation and management  ASSESSMENT:   Patient with PMH significant for morbid obesity, DM, HTN, and HLD. Presents this admission with COVID-19 PNA.   2/5- worsened respiratory status, intubated  2/7- bradycardic event, near arrest   High vent requirements continue. Now requiring increased pressor support. No future proning due to bradycardic episode.   Shows to be hyperkalemic today. Defer renal formula as pt remains hypernatremia. Lokelma started today. Monitor labs.   Pt with new stage II wound on his lip and oral thrush. Would likely benefit from Cortrak placement. Holding off today due to bleeding.   Admission weight: 202.8 kg  Current weight: 179 kg   Patient remains intubated on ventilator support MV: 15 L/min Temp (24hrs), Avg:100.6 F (38.1 C), Min:92.1 F (33.4 C), Max:103.3 F (39.6 C)   I/O: -6,400 ml since admit  UOP: 2,030 ml x 24 hrs   Drips: levophed, vasopressin  Medications: 500 mg Vit C daily, ss novolog, lantus, tradjenta, solumedrol, miralax, lokelma, 220 mg zinc sulfate daily  Labs: Na 154 (H)-trending down K 5.5 (H) Cr 2.41-plateau CBG 168-339  Diet Order:   Diet Order            Diet NPO time  specified  Diet effective now              EDUCATION NEEDS:   Not appropriate for education at this time  Skin:  Skin Assessment: Skin Integrity Issues: Skin Integrity Issues:: Other (Comment), Stage II Stage II: lip Other: MASD- abdomen, breast  Last BM:  2/5  Height:   Ht Readings from Last 1 Encounters:  01/27/20 5\' 10"  (1.778 m)    Weight:   Wt Readings from Last 1 Encounters:  01/29/20 (!) 179 kg    Ideal Body Weight:  75.5 kg  BMI:  Body mass index is 56.62 kg/m.  Estimated Nutritional Needs:   Kcal:  03/28/20 kcal  Protein:  151-189  grams  Fluid:  >/= 2.1 L/day   [phone] RD, LDN Clinical Nutrition Pager listed in AMION

## 2020-01-29 NOTE — Progress Notes (Signed)
Inpatient Diabetes Program Recommendations  AACE/ADA: New Consensus Statement on Inpatient Glycemic Control (2015)  Target Ranges:  Prepandial:   less than 140 mg/dL      Peak postprandial:   less than 180 mg/dL (1-2 hours)      Critically ill patients:  140 - 180 mg/dL   Lab Results  Component Value Date   GLUCAP 277 (H) 01/29/2020   HGBA1C 7.0 (H) 01/23/2020    Review of Glycemic Control Results for NENG, ALBEE (MRN [id number]) as of 01/29/2020 10:02  Ref. Range 01/28/2020 19:59 01/28/2020 23:48 01/29/2020 03:42 01/29/2020 07:25  Glucose-Capillary Latest Ref Range: 70 - 99 mg/dL 599 (H) 234 (H) 144 (H) 277 (H)   Diabetes history: Type 2 DM Outpatient Diabetes medications: Metformin 500 mg BID Current orders for Inpatient glycemic control: Novolog 6 units Q4H, Novolog 0-20 unit Q4H, Tradjenta 5 mg QD Solumedrol 40 mg QD Vital 1.5 cal @ 40 ml/hr  Inpatient Diabetes Program Recommendations:    Noted addition of tube feed coverage yesterday along with increase this AM. If trends continue to exceed 180-200s mg/dL consider adding Levemir 10 units BID.   Thanks, Lujean Rave, MSN, RNC-OB Diabetes Coordinator [phone] (8a-5p)

## 2020-01-30 ENCOUNTER — Inpatient Hospital Stay (HOSPITAL_COMMUNITY): Payer: Medicare HMO

## 2020-01-30 DIAGNOSIS — J1282 Pneumonia due to coronavirus disease 2019: Secondary | ICD-10-CM

## 2020-01-30 DIAGNOSIS — R739 Hyperglycemia, unspecified: Secondary | ICD-10-CM

## 2020-01-30 DIAGNOSIS — R000 Tachycardia, unspecified: Secondary | ICD-10-CM

## 2020-01-30 LAB — POCT I-STAT 7, (LYTES, BLD GAS, ICA,H+H)
Acid-Base Excess: 8 mmol/L — ABNORMAL HIGH (ref 0.0–2.0)
Acid-Base Excess: 7 mmol/L — ABNORMAL HIGH (ref 0.0–2.0)
Acid-Base Excess: 7 mmol/L — ABNORMAL HIGH (ref 0.0–2.0)
Bicarbonate: 37.6 mmol/L — ABNORMAL HIGH (ref 20.0–28.0)
Bicarbonate: 36 mmol/L — ABNORMAL HIGH (ref 20.0–28.0)
Bicarbonate: 36.5 mmol/L — ABNORMAL HIGH (ref 20.0–28.0)
Calcium, Ion: 1.29 mmol/L (ref 1.15–1.40)
Calcium, Ion: 1.29 mmol/L (ref 1.15–1.40)
Calcium, Ion: 1.28 mmol/L (ref 1.15–1.40)
HCT: 38 % — ABNORMAL LOW (ref 39.0–52.0)
HCT: 36 % — ABNORMAL LOW (ref 39.0–52.0)
HCT: 38 % — ABNORMAL LOW (ref 39.0–52.0)
Hemoglobin: 12.9 g/dL — ABNORMAL LOW (ref 13.0–17.0)
Hemoglobin: 12.2 g/dL — ABNORMAL LOW (ref 13.0–17.0)
Hemoglobin: 12.9 g/dL — ABNORMAL LOW (ref 13.0–17.0)
O2 Saturation: 50 %
O2 Saturation: 63 %
O2 Saturation: 69 %
Patient temperature: 37.8
Patient temperature: 37.8
Patient temperature: 37.6
Potassium: 5.5 mmol/L — ABNORMAL HIGH (ref 3.5–5.1)
Potassium: 5.5 mmol/L — ABNORMAL HIGH (ref 3.5–5.1)
Potassium: 5.6 mmol/L — ABNORMAL HIGH (ref 3.5–5.1)
Sodium: 159 mmol/L — ABNORMAL HIGH (ref 135–145)
Sodium: 158 mmol/L — ABNORMAL HIGH (ref 135–145)
Sodium: 158 mmol/L — ABNORMAL HIGH (ref 135–145)
TCO2: 40 mmol/L — ABNORMAL HIGH (ref 22–32)
TCO2: 38 mmol/L — ABNORMAL HIGH (ref 22–32)
TCO2: 39 mmol/L — ABNORMAL HIGH (ref 22–32)
pCO2 arterial: 82.3 mmHg (ref 32.0–48.0)
pCO2 arterial: 77.4 mmHg (ref 32.0–48.0)
pCO2 arterial: 76.9 mmHg (ref 32.0–48.0)
pH, Arterial: 7.272 — ABNORMAL LOW (ref 7.350–7.450)
pH, Arterial: 7.279 — ABNORMAL LOW (ref 7.350–7.450)
pH, Arterial: 7.288 — ABNORMAL LOW (ref 7.350–7.450)
pO2, Arterial: 33 mmHg — CL (ref 83.0–108.0)
pO2, Arterial: 40 mmHg — CL (ref 83.0–108.0)
pO2, Arterial: 43 mmHg — ABNORMAL LOW (ref 83.0–108.0)

## 2020-01-30 LAB — GLUCOSE, CAPILLARY
Glucose-Capillary: 215 mg/dL — ABNORMAL HIGH (ref 70–99)
Glucose-Capillary: 215 mg/dL — ABNORMAL HIGH (ref 70–99)
Glucose-Capillary: 245 mg/dL — ABNORMAL HIGH (ref 70–99)
Glucose-Capillary: 237 mg/dL — ABNORMAL HIGH (ref 70–99)
Glucose-Capillary: 263 mg/dL — ABNORMAL HIGH (ref 70–99)

## 2020-01-30 LAB — CBC
HCT: 44.2 % (ref 39.0–52.0)
Hemoglobin: 12.1 g/dL — ABNORMAL LOW (ref 13.0–17.0)
MCH: 29.4 pg (ref 26.0–34.0)
MCHC: 27.4 g/dL — ABNORMAL LOW (ref 30.0–36.0)
MCV: 107.3 fL — ABNORMAL HIGH (ref 80.0–100.0)
Platelets: 256 10*3/uL (ref 150–400)
RBC: 4.12 MIL/uL — ABNORMAL LOW (ref 4.22–5.81)
RDW: 14.5 % (ref 11.5–15.5)
WBC: 27.2 10*3/uL — ABNORMAL HIGH (ref 4.0–10.5)
nRBC: 0 % (ref 0.0–0.2)

## 2020-01-30 LAB — COMPREHENSIVE METABOLIC PANEL
ALT: 78 U/L — ABNORMAL HIGH (ref 0–44)
AST: 67 U/L — ABNORMAL HIGH (ref 15–41)
Albumin: 2.9 g/dL — ABNORMAL LOW (ref 3.5–5.0)
Alkaline Phosphatase: 61 U/L (ref 38–126)
Anion gap: 11 (ref 5–15)
BUN: 62 mg/dL — ABNORMAL HIGH (ref 6–20)
CO2: 33 mmol/L — ABNORMAL HIGH (ref 22–32)
Calcium: 8.9 mg/dL (ref 8.9–10.3)
Chloride: 116 mmol/L — ABNORMAL HIGH (ref 98–111)
Creatinine, Ser: 1.26 mg/dL — ABNORMAL HIGH (ref 0.61–1.24)
GFR calc Af Amer: >60 mL/min (ref >60)
GFR calc non Af Amer: >60 mL/min (ref >60)
Glucose, Bld: 228 mg/dL — ABNORMAL HIGH (ref 70–99)
Potassium: 5.2 mmol/L — ABNORMAL HIGH (ref 3.5–5.1)
Sodium: 160 mmol/L — ABNORMAL HIGH (ref 135–145)
Total Bilirubin: 0.4 mg/dL (ref 0.3–1.2)
Total Protein: 6.6 g/dL (ref 6.5–8.1)

## 2020-01-30 LAB — CULTURE, BLOOD (ROUTINE X 2): Special Requests: ADEQUATE

## 2020-01-30 LAB — CK: Total CK: 1196 U/L — ABNORMAL HIGH (ref 49–397)

## 2020-01-30 MED ORDER — CISATRACURIUM BOLUS VIA INFUSION
0.0500 mg/kg | Freq: Once | INTRAVENOUS | Status: AC
  Administered 2020-01-30: 18:00:00 9 mg via INTRAVENOUS
  Filled 2020-01-30: qty 9

## 2020-01-30 MED ORDER — FLUDROCORTISONE ACETATE 0.1 MG PO TABS
0.1000 mg | ORAL_TABLET | Freq: Every day | ORAL | Status: DC
  Administered 2020-01-31 – 2020-02-01 (×2): 0.1 mg
  Filled 2020-01-30 (×4): qty 1

## 2020-01-30 MED ORDER — HYDROCORTISONE NA SUCCINATE PF 100 MG IJ SOLR
50.0000 mg | Freq: Four times a day (QID) | INTRAMUSCULAR | Status: DC
  Administered 2020-01-30 – 2020-02-01 (×9): 50 mg via INTRAVENOUS
  Filled 2020-01-30 (×9): qty 2

## 2020-01-30 MED ORDER — VANCOMYCIN HCL 1250 MG/250ML IV SOLN
1250.0000 mg | Freq: Two times a day (BID) | INTRAVENOUS | Status: DC
  Administered 2020-01-30 – 2020-01-31 (×2): 1250 mg via INTRAVENOUS
  Filled 2020-01-30 (×2): qty 250

## 2020-01-30 MED ORDER — FUROSEMIDE 10 MG/ML IJ SOLN
60.0000 mg | Freq: Once | INTRAMUSCULAR | Status: AC
  Administered 2020-01-30: 60 mg via INTRAVENOUS
  Filled 2020-01-30: qty 6

## 2020-01-30 MED ORDER — DEXTROSE 5 % IV SOLN: INTRAVENOUS | Status: DC

## 2020-01-30 MED ORDER — VANCOMYCIN HCL 10 G IV SOLR
2500.0000 mg | INTRAVENOUS | Status: AC
  Administered 2020-01-30: 2500 mg via INTRAVENOUS
  Filled 2020-01-30: qty 2000

## 2020-01-30 MED ORDER — SODIUM CHLORIDE 0.9 % IV SOLN
2.0000 g | Freq: Three times a day (TID) | INTRAVENOUS | Status: DC
  Administered 2020-01-30 – 2020-02-01 (×7): 2 g via INTRAVENOUS
  Filled 2020-01-30 (×7): qty 2

## 2020-01-30 MED ORDER — STERILE WATER FOR INJECTION IJ SOLN
INTRAMUSCULAR | Status: AC
  Filled 2020-01-30: qty 10

## 2020-01-30 MED ORDER — SODIUM CHLORIDE 0.9 % IV SOLN
0.0000 ug/kg/min | INTRAVENOUS | Status: DC
  Administered 2020-01-30: 3 ug/kg/min via INTRAVENOUS
  Administered 2020-01-31 (×2): 1 ug/kg/min via INTRAVENOUS
  Administered 2020-02-01: 8 ug/kg/min via INTRAVENOUS
  Administered 2020-02-01: 17:00:00 1 ug/kg/min via INTRAVENOUS
  Administered 2020-02-01: 11:00:00 8 ug/kg/min via INTRAVENOUS
  Filled 2020-01-30 (×9): qty 20

## 2020-01-30 MED ORDER — INSULIN ASPART 100 UNIT/ML ~~LOC~~ SOLN
15.0000 [IU] | SUBCUTANEOUS | Status: DC
  Administered 2020-01-30 – 2020-02-01 (×13): 15 [IU] via SUBCUTANEOUS

## 2020-01-30 NOTE — Progress Notes (Signed)
RT Note:  ABG results given to RN.

## 2020-01-30 NOTE — Progress Notes (Signed)
Pharmacy Antibiotic Note  Jamy Cleckler is a 54 y.o. male admitted on 01/26/2020 with COVID-19 pneumonia.  Pharmacy has been consulted for cefepime dosing for HCAP, and is consulted to add Vancomycin for possible bacteremia SCr decreased to 1.2, Tmax 103.3, current temperature 98.2, WBC remains elevated at 27.  Lactic acid decreased to 2.  Plan: Cefepime 2g IV q8h Vancomycin 2500 mg IV loading dose, then 1250 IV q12h. Check vanc random level with AM labs to evaluate vanc dosing with variable renal function. Measure Vanc peak and trough at steady state.  Goal AUC = 400 - 550. Est AUC 449 using SCr 1.26 and dosing Vd 0.5 Follow up renal function, culture results, and clinical course.   Height: 5\' 10"  (177.8 cm) Weight: (!) 397 lb 0.8 oz (180.1 kg) IBW/kg (Calculated) : 73  Temp (24hrs), Avg:98.3 F (36.8 C), Min:90.3 F (32.4 C), Max:103.3 F (39.6 C)  Recent Labs  Lab 01/26/20 1935 01/26/20 1935 01/27/20 0445 01/28/20 0415 01/28/20 1655 01/28/20 2125 01/29/20 0612 01/29/20 1305 01/30/20 0536  WBC 29.3*  --  30.0* 27.7*  --   --  28.5*  --  27.2*  CREATININE 3.23*   < > 2.34* 1.71*  --   --  2.41* 2.41* 1.26*  LATICACIDVEN 1.6  --   --   --  2.8* 2.2*  --  2.0*  --    < > = values in this interval not displayed.    Estimated Creatinine Clearance: 109.8 mL/min (A) (by C-G formula based on SCr of 1.26 mg/dL (H)).    Allergies  Allergen Reactions  . Mushroom Extract Complex Rash    Antimicrobials this admission: CTX  2/2 >>2/6 Azith 2/2 >> 2/6 Remdes 2/2 >> 2/6 Tocilizumab 2/2  Cefepime 2/9 >>  Vancomycin 2/11 >>   Dose adjustments this admission:  Microbiology results: 2/2 Covid + 2/2 BCx: NGF 2/8 MRSA PCR: negative 2/9 BCx: 1/3 bottles GPC 2/9 TA: rare yeast, rare GPC.  Cxt pending.  Thank you for allowing pharmacy to be a part of this patient's care.  4/9 PharmD, BCPS Clinical pharmacist phone 7am- 5pm: [phone] 01/30/2020 7:59 AM

## 2020-01-30 NOTE — Procedures (Signed)
Bronchoscopy Procedure Note Gregory Hampton [id number] 01-Apr-1966  Procedure: Bronchoscopy Indications: Diagnostic evaluation of the airways  Procedure Details Consent: Unable to obtain consent because of emergent medical necessity. Patient with severe refractory hypoxia. Time Out: Verified patient identification, verified procedure, site/side was marked, verified correct patient position, special equipment/implants available, medications/allergies/relevent history reviewed, required imaging and test results available.  Performed  In preparation for procedure, patient was given 100% FiO2. Sedation: continuous sedatives per [facility]  Airway entered and the following bronchi were examined: RUL, RML, RLL, LUL, LLL and Bronchi.   Procedures performed: Brushings performed Bronchoscope removed.    Evaluation Hemodynamic Status: BP stable throughout; O2 sats: saturations remained below goal Patient's Current Condition: unstable Specimens:  None Complications: No apparent complications Patient did tolerate procedure well.   Steffanie Dunn 01/30/2020

## 2020-01-30 NOTE — Progress Notes (Signed)
Patient upon assessment is non-responsive.  He is currently receiving Dilaudid 4mg /hr, Versed 3mg /hr, Levo 9mcg/min, Vaso 0.03 units/min, Neo 273mcg/min.  Patients arterial line for hemodynamic monitoring was zeroed and records SBP in the mid 80's - mid 90's. Peripheral B/P 106/57 (71).  HR 109, RR 35, Patient saturates in the mid 80's currently 85%; Receiving respiratory support therapy with ETT 8.5 28cm @ lip 100% FiO2 PEEP 16 RATE 35 TV 500.  His breath sounds are diminished and his heart sound upon auscultation is S1S2.  He has no signs of weeping but patient is very obese and has skin folds that have to been treated with wicking and powder to stop moisture associated dermatitis.  He has a skin injury to his upper lip d/t possible pressure from prior ETT placement; tx with mepilex. Urine output via foley cath is WDL however appearance is amber and cloudy with occasional sediment.  Patient feeding via O/G tube 47fr @ 71cm marked with a black line and delivering vital 1.5 at goal of 12ml/hr with q2 19f flush.  Left I/J triple lumen PICC patent.

## 2020-01-30 NOTE — Progress Notes (Signed)
Inpatient Diabetes Program Recommendations  AACE/ADA: New Consensus Statement on Inpatient Glycemic Control (2015)  Target Ranges:  Prepandial:   less than 140 mg/dL      Peak postprandial:   less than 180 mg/dL (1-2 hours)      Critically ill patients:  140 - 180 mg/dL   Lab Results  Component Value Date   GLUCAP 245 (H) 01/30/2020   HGBA1C 7.0 (H) 01/23/2020    Review of Glycemic Control Results for Gregory Hampton, Gregory Hampton (MRN [id number]) as of 01/30/2020 09:01  Ref. Range 01/29/2020 19:33 01/29/2020 23:42 01/30/2020 04:23 01/30/2020 07:34  Glucose-Capillary Latest Ref Range: 70 - 99 mg/dL 994 (H) 129 (H) 047 (H) 245 (H)    Diabetes history: Type 2 DM Outpatient Diabetes medications: Metformin 500 mg BID Current orders for Inpatient glycemic control: Novolog 15 units Q4H, Novolog 0-20 unit Q4H, Tradjenta 5 mg QD, Lantus 10 units QD Solumedrol 40 mg QD Vital 1.5 cal @ 40 ml/hr  Inpatient Diabetes Program Recommendations:    Noted increase to tube feed coverage. If trends continue to exceed 180-200s mg/dL consider increasing Lantus to 10 units BID.   Thanks, Lujean Rave, MSN, RNC-OB Diabetes Coordinator [phone] (8a-5p)

## 2020-01-30 NOTE — Progress Notes (Signed)
PHARMACY - PHYSICIAN COMMUNICATION CRITICAL VALUE ALERT - BLOOD CULTURE IDENTIFICATION (BCID)  Gregory Hampton is an 54 y.o. male who presented to [facility] on 02/15/2020 with a chief complaint of CoVid-19 Pneumonia  Assessment:  2/9 BC: 1 of 3 blood cultures (aerobic only)  + for GPC in clusters    Name of physician (or Provider) Contacted: Dr. Antionette Char  Current antibiotics: cefepime 2g q12h  Changes to prescribed antibiotics recommended:  Patient is on recommended antibiotics - No changes needed  Possible contaminant   No results found for this or any previous visit.  [name] 01/30/2020  5:44 AM

## 2020-01-30 NOTE — Plan of Care (Signed)
Called to bedside to for sudden drop in saturations to 60s. Not preceded by any obvious precipitating factors. Suction catheter passed without significant secretions. Vent pressures stable and volumes being delivered. Bedside US with sliding lung throughout bilaterally.  Hyperdynamic heart; no significant RV dilation. Given concern for possible medication aspiration earlier, emergent bedside bronchoscopy was performed to evaluate for airway occlusion- non found; patent airways throughout. CXR without obvious pneumothorax. Saturations spontaneously recovered to the low 90s, but ABG confirmed persistent PaO2 38.  I have discussed with Gregory Hampton 2 sisters that he has had increasing vent requirements overnight and this morning and with this episode, I am not sure what has precipitated his decline, but it is an ominous sign, especially given that there are no rapidly reversible etiologies that we can address to prevent further decline. I am concerned that his deteriorating respiratory status is likely to cause cardiac decompensation and likely an arrest. Without a rapidly reversible etiology, I am not convinced that ACLS and CPR would be beneficial. They understand and with for him to remain full code at this time. They have asked again about the possibility of ivermectin, which I confirmed with out clinical pharmacist is recommended against the use of per [facility] Cov[facility] protocols as there has not been reasonable evidence of benefit. Unfortunately he is also not a candidate for convalescent plasma this far into his course.  CC time: 40 minutes  Steffanie Dunn, DO 01/30/20 1:32 PM [facility]

## 2020-01-30 NOTE — Progress Notes (Signed)
Updated sisters Roanna Raider and Inetta Fermo. Questions and concerns addressed. Pt's critical stated mentioned, sisters stated they wanted everything done regardless of how long he remains intubated. No other issues at this time.

## 2020-01-30 NOTE — Progress Notes (Signed)
RT Note:  Tidal Volume increased to 500 (7cc) per Elink based on ABG results.

## 2020-01-30 NOTE — Progress Notes (Signed)
NAMEArmand Hampton, MRN:  [id number], DOB:  01/06/1966, LOS: 9 ADMISSION DATE:  01/26/2020, CONSULTATION DATE:  2/4 REFERRING MD:  Dr. Jarvis Newcomer, CHIEF COMPLAINT:  COVID 19 pneumonia   Brief History    54 y/o with admitted on 2/2 with severe COVID 19 pneumonia. Intubated on 2/5, placed in prone position. Bradycardic cardiac arrest on 2/7.   Past Medical History  DM2 HTN HLD Obesity  Significant Hospital Events   2/2 Admit 2/5 Intubated 2/6 Proned, paralyzed 2/7 Developed hematuria, bradycardic event, near arrest 2/8 ETT exchanged  Consults:  PCCM  Procedures:  OETT 2/5 >> Lt IJ CVL 2/5 >> Rt radial A line 2/6 >>  Significant Diagnostic Tests:  TTE 2/9-LV function not able to be assessed, moderate LVH.  Normal LA, RV size and function, RA.  Normal RV size with greater than 50% variability.  Trace TR, otherwise normal valves.  Micro Data:  Blood cultures 2/2-negative Blood cultures 2/9>> Trach aspirate 2/9>> moderate PMNs, rare yeast, rare GPC MRSA nares 2/8- negative  Antimicrobials:  Convalescent plasma 2/2 Tocilizumab 2/2 Azithromycin 2/2 >> 2/5  Ceftriaxone 2/2 >> 2/6 Remdesivir 2/2 >> 2/6  Solu-Medrol 2/2 >   Interim history/subjective:   Increasing vent requirements overnight. Had an episode of severe desaturation this afternoon- saturations improved but PaO2 remains low.  Objective   Blood pressure 116/69, pulse (!) 117, temperature 99.9 F (37.7 C), resp. rate (!) 32, height 5\' 10"  (1.778 m), weight (!) 180.1 kg, SpO2 93 %.    Vent Mode: PRVC FiO2 (%):  [90 %-100 %] 100 % Set Rate:  [32 bmp-35 bmp] 32 bmp Vt Set:  [430 mL-530 mL] 530 mL PEEP:  [16 cmH20-18 cmH20] 18 cmH20 Plateau Pressure:  [27 cmH20-38 cmH20] 38 cmH20   Intake/Output Summary (Last 24 hours) at 01/30/2020 1658 Last data filed at 01/30/2020 1500 Gross per 24 hour  Intake 3677.46 ml  Output 3300 ml  Net 377.46 ml   Filed Weights   01/28/20 0500 01/29/20 0500 01/30/20 0437   Weight: (!) 175 kg (!) 179 kg (!) 180.1 kg    Examination:  General: Critically ill-appearing man lying in bed intubated, sedated HENT: [place]/AT, eyes anicteric.  ETT in place, oral mucosa moist.  Mild scleral edema. PULM: Distant breath sounds, bilateral rales.  Breathing synchronously with the vent. CV: Minimally tachycardic, regular rhythm, no murmurs GI: Obese, soft, nontender, nondistended GU: Foley draining amber urine MSK: Minimal lower extremity edema, no clubbing or cyanosis Neuro: RASS -5, not withdrawing to pain.  Pupils reactive bilaterally Derm: No rashes or bruising   2/11 CXR personally reviewed>> diffuse bilateral airspace disease, ET tube in appropriate position, no pneumothorax.   Resolved Hospital Problem list     Assessment & Plan:  COVID 19 pneumonia ARDS Compensated hypercarbia> likely severe dead space from ARDS Likely acute lobar pneumonia on the left Possible aspiration episode with medications suctioned out of pharynx today, no tube feeds suctioned. No TF or unexpected fluids in the bronchi today. -Continue full vent support-unfortunately unable to meet ARDS pressure goals on the vent, so titrating up tidal volume in an effort to improve oxygenation. -Unfortunately the risk of proning likely outweighs the benefit with a near cardiac arrest associated with proning earlier this admission.  He has been intolerant to repositioning for usual care. -Aggressive diuresis today; may require increased vasopressors -As needed neuromuscular blockade for vent dyssynchrony -Holding tube feeds for now -Repeat ABG this afternoon demonstrates no significant benefit with vent changes.  Continue  supportive care. Will initiate nimbex infusion.  Need for sedation -RASS -4 to -5 to facilitate vent synchrony -Continue Dilaudid and Versed infusions with enteral medications to supplement  Possible Gram-positive bacteremia, 55/3 bottles-contaminant versus pathogen -Adding  vancomycin -Continue cefepime  Shock-septic versus due to sedation.  Bedside echo with hyperdynamic function, no RV dilation. -Vasopressors as required to maintain MAP greater than 65 -Broad-spectrum antibiotics  Sinus tachycardia> likely due to fevers and high metabolic demands Cardiac arrest, bradycardia, in prone position on 2/7 CVP 21.  No evidence of cardiomyopathy on echocardiogram. -Continue cooling blanket and acetaminophen as needed -Continue broad-spectrum antibiotics; adding vancomycin given the use of invasive vascular catheters -Continue aggressive sedation  DM2 with hyperglycemia-remains uncontrolled -Continue linagliptin -Continue long-acting insulin and scheduled short acting insulin with tube feeds.  Sliding scale insulin as needed -Goal BG 140-181 admitted to the ICU  Hematuria > resolved. Unknown etiology.  Previously requiring CBI. -Continue monitoring for bleeding -Daily CBC  AKI> improved, subsequently worsened again -Continue to monitor -Strict I's/O -Renally dose meds and avoid nephrotoxic meds.  Appreciate pharmacy's assistance with careful monitoring of vancomycin dosing  Hyperkalemia -Continue Lokelma twice daily -Lasix -Continue to monitor -Appreciate RD's assistance with recommendations regarding tube feeds-holding off on renal tube feed for now given high fat content previous hypertriglyceridemia.  Guarded prognosis given severity of illness and deterioration over the last 24 hours.   Best practice:  Diet: tube feeding Pain/Anxiety/Delirium protocol (if indicated): Ordered VAP protocol (if indicated): yes DVT prophylaxis: Lovenox GI prophylaxis: Pantoprazole for stress ulcer prophylaxis Glucose control: SSI, linagliptin Mobility: bed rest Code Status: full- confirmed with sisters again today Family Communication: I spoke to both sisters, Cordelia Pen and Eber Hong today on the phone Disposition:  ICU  Labs   CBC: Recent Labs  Lab 01/25/20 0505  01/25/20 0505 01/26/20 0450 01/26/20 0452 01/26/20 1935 01/26/20 1935 01/27/20 0445 01/27/20 0537 01/28/20 0415 01/28/20 0924 01/29/20 0612 01/30/20 0536 01/30/20 1225 01/30/20 1308 01/30/20 1538  WBC 23.4*   < > 23.3*   < > 29.3*  --  30.0*  --  27.7*  --  28.5* 27.2*  --   --   --   NEUTROABS 19.3*  --  18.7*  --  24.4*  --   --   --  21.9*  --  23.5*  --   --   --   --   HGB 12.4*   < > 13.3   < > 13.6   < > 13.3   < > 13.4   < > 12.0* 12.1* 12.9* 12.2* 12.9*  HCT 41.1   < > 44.1   < > 44.6   < > 44.4   < > 45.9   < > 43.4 44.2 38.0* 36.0* 38.0*  MCV 97.2   < > 99.8   < > 98.0  --  99.1  --  100.4*  --  105.6* 107.3*  --   --   --   PLT 372   < > 343   < > 399  --  437*  --  401*  --  321 256  --   --   --    < > = values in this interval not displayed.    Basic Metabolic Panel: Recent Labs  Lab 01/26/20 0450 01/26/20 2878 01/26/20 1935 01/26/20 1935 01/27/20 0445 01/27/20 0537 01/28/20 0415 01/28/20 6767 01/29/20 0612 01/29/20 0612 01/29/20 1305 01/30/20 0536 01/30/20 1225 01/30/20 1308 01/30/20 1538  NA 142   < >  142   < > 145   < > 152*   < > 158*   < > 154* 160* 159* 158* 158*  K 4.9   < > 4.6   < > 4.6   < > 4.7   < > 5.1   < > 5.5* 5.2* 5.5* 5.5* 5.6*  CL 101   < > 99   < > 104  --  109  --  118*  --  116* 116*  --   --   --   CO2 29   < > 26   < > 30  --  34*  --  33*  --  33* 33*  --   --   --   GLUCOSE 181*   < > 211*   < > 235*  --  258*  --  288*  --  339* 228*  --   --   --   BUN 90*   < > 97*   < > 91*  --  73*  --  89*  --  90* 62*  --   --   --   CREATININE 3.14*   < > 3.23*   < > 2.34*  --  1.71*  --  2.41*  --  2.41* 1.26*  --   --   --   CALCIUM 8.6*   < > 8.5*   < > 8.9  --  9.3  --  8.7*  --  8.3* 8.9  --   --   --   MG 3.1*  --  3.2*  --  3.3*  --   --   --   --   --   --   --   --   --   --   PHOS 9.5*  --  8.4*  --  5.9*  --   --   --   --   --   --   --   --   --   --    < > = values in this interval not displayed.   GFR: Estimated  Creatinine Clearance: 109.8 mL/min (A) (by C-G formula based on SCr of 1.26 mg/dL (H)). Recent Labs  Lab 01/26/20 1935 01/26/20 1935 01/27/20 0445 01/28/20 0415 01/28/20 1655 01/28/20 2125 01/29/20 0612 01/29/20 1305 01/30/20 0536  WBC 29.3*   < > 30.0* 27.7*  --   --  28.5*  --  27.2*  LATICACIDVEN 1.6  --   --   --  2.8* 2.2*  --  2.0*  --    < > = values in this interval not displayed.    Liver Function Tests: Recent Labs  Lab 01/25/20 0505 01/26/20 0450 01/26/20 1935 01/29/20 0612 01/30/20 0536  AST 24 65* 58* 65* 67*  ALT 28 43 47* 74* 78*  ALKPHOS 39 66 63 51 61  BILITOT 0.3 0.6 0.3 0.7 0.4  PROT 5.2* 7.4 7.3 6.3* 6.6  ALBUMIN 2.2* 3.2* 3.1* 2.7* 2.9*   No results for input(s): LIPASE, AMYLASE in the last 168 hours. Recent Labs  Lab 01/26/20 1935  AMMONIA 60*    ABG    Component Value Date/Time   PHART 7.288 (L) 01/30/2020 1538   PCO2ART 76.9 (HH) 01/30/2020 1538   PO2ART 43.0 (L) 01/30/2020 1538   HCO3 36.5 (H) 01/30/2020 1538   TCO2 39 (H) 01/30/2020 1538   O2SAT 69.0 01/30/2020 1538     Coagulation Profile: Recent Labs  Lab 01/26/20  1935  INR 1.2    Cardiac Enzymes: Recent Labs  Lab 01/29/20 1305 01/30/20 0536  CKTOTAL 1,270* 1,196*    HbA1C: Hgb A1c MFr Bld  Date/Time Value Ref Range Status  01/23/2020 03:45 AM 7.0 (H) 4.8 - 5.6 % Final    Comment:    (NOTE) Pre diabetes:          5.7%-6.4% Diabetes:              >6.4% Glycemic control for   <7.0% adults with diabetes     CBG: Recent Labs  Lab 01/29/20 2342 01/30/20 0318 01/30/20 0423 01/30/20 0734 01/30/20 1226  GLUCAP 280* 215* 215* 245* 237*     This patient is critically ill with multiple organ system failure which requires frequent high complexity decision making, assessment, support, evaluation, and titration of therapies. This was completed through the application of advanced monitoring technologies and extensive interpretation of multiple databases. During  this encounter critical care time was devoted to patient care services described in this note for 50 minutes.  Steffanie Dunn, DO 01/30/20 4:58 PM [facility]

## 2020-01-30 NOTE — Progress Notes (Signed)
Patient remains unresponsive to pain and voice.  Upon bathing and repositioning this afternoon he became tachycardic in the 130's with an O2 saturation of 61%.  Contacted physician who order Bronchoscope and ultrasound of the chest.  Both revealed no findings.  An ABG revealed a pO2 of 35.  Patient still on FiO2 100% Peep 18 Rate 32, TV 530.  During the early afternoon while delivering his med via O/G tube, patient presented with emesis.  I suctioned a white froth from his mouth copious in volume.  Stopped tube fees around 1100.  Notified physician who placed a KUB.  The KUB reported tube to be somewhere in the diagphram region.  I advanced the O/G tube 10cm as ordered by physician and ordered another xray.  Awaiting results. Patient receiving Versed 4mg /hr, dilauded 4mg /hr, Levo 37mcg/min, vaso 0.03 units/min, NEO 231mcg/min and Nimbex 3 mcg/kg/min.  Vitals are WDL. TOF unsuccessful.  BIS monitor marking 41.  Will continue to monitor.

## 2020-01-31 LAB — POCT I-STAT 7, (LYTES, BLD GAS, ICA,H+H)
Acid-Base Excess: 4 mmol/L — ABNORMAL HIGH (ref 0.0–2.0)
Bicarbonate: 31.5 mmol/L — ABNORMAL HIGH (ref 20.0–28.0)
Calcium, Ion: 1.18 mmol/L (ref 1.15–1.40)
HCT: 30 % — ABNORMAL LOW (ref 39.0–52.0)
Hemoglobin: 10.2 g/dL — ABNORMAL LOW (ref 13.0–17.0)
O2 Saturation: 80 %
Patient temperature: 37.7
Potassium: 4.6 mmol/L (ref 3.5–5.1)
Sodium: 157 mmol/L — ABNORMAL HIGH (ref 135–145)
TCO2: 33 mmol/L — ABNORMAL HIGH (ref 22–32)
pCO2 arterial: 64 mmHg — ABNORMAL HIGH (ref 32.0–48.0)
pH, Arterial: 7.303 — ABNORMAL LOW (ref 7.350–7.450)
pO2, Arterial: 53 mmHg — ABNORMAL LOW (ref 83.0–108.0)

## 2020-01-31 LAB — CULTURE, RESPIRATORY W GRAM STAIN: Special Requests: NORMAL

## 2020-01-31 LAB — COMPREHENSIVE METABOLIC PANEL
ALT: 61 U/L — ABNORMAL HIGH (ref 0–44)
AST: 43 U/L — ABNORMAL HIGH (ref 15–41)
Albumin: 2.6 g/dL — ABNORMAL LOW (ref 3.5–5.0)
Alkaline Phosphatase: 59 U/L (ref 38–126)
Anion gap: 6 (ref 5–15)
BUN: 58 mg/dL — ABNORMAL HIGH (ref 6–20)
CO2: 34 mmol/L — ABNORMAL HIGH (ref 22–32)
Calcium: 8.6 mg/dL — ABNORMAL LOW (ref 8.9–10.3)
Chloride: 116 mmol/L — ABNORMAL HIGH (ref 98–111)
Creatinine, Ser: 1.39 mg/dL — ABNORMAL HIGH (ref 0.61–1.24)
GFR calc Af Amer: >60 mL/min (ref >60)
GFR calc non Af Amer: 57 mL/min — ABNORMAL LOW (ref >60)
Glucose, Bld: 259 mg/dL — ABNORMAL HIGH (ref 70–99)
Potassium: 5.3 mmol/L — ABNORMAL HIGH (ref 3.5–5.1)
Sodium: 156 mmol/L — ABNORMAL HIGH (ref 135–145)
Total Bilirubin: 0.5 mg/dL (ref 0.3–1.2)
Total Protein: 6.2 g/dL — ABNORMAL LOW (ref 6.5–8.1)

## 2020-01-31 LAB — CBC
HCT: 39.9 % (ref 39.0–52.0)
Hemoglobin: 11.3 g/dL — ABNORMAL LOW (ref 13.0–17.0)
MCH: 30.2 pg (ref 26.0–34.0)
MCHC: 28.3 g/dL — ABNORMAL LOW (ref 30.0–36.0)
MCV: 106.7 fL — ABNORMAL HIGH (ref 80.0–100.0)
Platelets: 205 10*3/uL (ref 150–400)
RBC: 3.74 MIL/uL — ABNORMAL LOW (ref 4.22–5.81)
RDW: 14.6 % (ref 11.5–15.5)
WBC: 18.6 10*3/uL — ABNORMAL HIGH (ref 4.0–10.5)
nRBC: 0.2 % (ref 0.0–0.2)

## 2020-01-31 LAB — GLUCOSE, CAPILLARY
Glucose-Capillary: 165 mg/dL — ABNORMAL HIGH (ref 70–99)
Glucose-Capillary: 176 mg/dL — ABNORMAL HIGH (ref 70–99)
Glucose-Capillary: 217 mg/dL — ABNORMAL HIGH (ref 70–99)
Glucose-Capillary: 219 mg/dL — ABNORMAL HIGH (ref 70–99)
Glucose-Capillary: 227 mg/dL — ABNORMAL HIGH (ref 70–99)
Glucose-Capillary: 231 mg/dL — ABNORMAL HIGH (ref 70–99)
Glucose-Capillary: 247 mg/dL — ABNORMAL HIGH (ref 70–99)

## 2020-01-31 LAB — TRIGLYCERIDES: Triglycerides: 277 mg/dL — ABNORMAL HIGH (ref <150)

## 2020-01-31 LAB — VANCOMYCIN, RANDOM: Vancomycin Rm: 14

## 2020-01-31 MED ORDER — SODIUM ZIRCONIUM CYCLOSILICATE 10 G PO PACK
10.0000 g | PACK | Freq: Two times a day (BID) | ORAL | Status: DC
  Administered 2020-01-31 – 2020-02-01 (×2): 10 g via ORAL
  Filled 2020-01-31 (×5): qty 1

## 2020-01-31 MED ORDER — FUROSEMIDE 10 MG/ML IJ SOLN
60.0000 mg | Freq: Once | INTRAMUSCULAR | Status: AC
  Administered 2020-01-31: 08:00:00 60 mg via INTRAVENOUS
  Filled 2020-01-31: qty 6

## 2020-01-31 MED ORDER — INSULIN GLARGINE 100 UNIT/ML ~~LOC~~ SOLN
10.0000 [IU] | Freq: Two times a day (BID) | SUBCUTANEOUS | Status: DC
  Administered 2020-01-31 – 2020-02-01 (×2): 10 [IU] via SUBCUTANEOUS
  Filled 2020-01-31 (×3): qty 0.1

## 2020-01-31 MED ORDER — SODIUM ZIRCONIUM CYCLOSILICATE 5 G PO PACK
5.0000 g | PACK | Freq: Once | ORAL | Status: AC
  Administered 2020-01-31: 14:00:00 5 g via ORAL
  Filled 2020-01-31: qty 1

## 2020-01-31 NOTE — Progress Notes (Signed)
Inpatient Diabetes Program Recommendations  AACE/ADA: New Consensus Statement on Inpatient Glycemic Control (2015)  Target Ranges:  Prepandial:   less than 140 mg/dL      Peak postprandial:   less than 180 mg/dL (1-2 hours)      Critically ill patients:  140 - 180 mg/dL   Lab Results  Component Value Date   GLUCAP 219 (H) 01/31/2020   HGBA1C 7.0 (H) 01/23/2020    Review of Glycemic Control Results for Gregory Hampton, Gregory Hampton (MRN [id number]) as of 01/31/2020 10:44  Ref. Range 01/30/2020 19:57 01/31/2020 00:27 01/31/2020 04:09 01/31/2020 08:20  Glucose-Capillary Latest Ref Range: 70 - 99 mg/dL 403 (H) 524 (H) 818 (H) 219 (H)   Diabetes history:Type 2 DM Outpatient Diabetes medications:Metformin 500 mg BID Current orders for Inpatient glycemic control:Novolog 15 units Q4H, Novolog 0-20 unit Q4H, Tradjenta 5 mg QD, Lantus 10 units QD Solumedrol 40 mg QD Vital 1.5 cal @ 40 ml/hr  D5% @ 40 ml/hr  Inpatient Diabetes Program Recommendations:  Tube feeds to be on hold, given aspiration risk.  Glucose trends continue to exceed inpatient goals of 180 mg/dL consider increasing Lantus to 10 units BID.   Discussed plan with Dr Chestine Spore. Reviewed inpatient trends, increased insulin requirements given status, plan for discontinuation of tube feeds and Dextrose that may help with glucose trends, continue with recommendations of BID dosing and increasing of levemir OR could add a one time dose of Levemir 10 units now to determine further needs by this afternoon and discussed Endotool as plausible solution. Will plan to continue with basal/bolus at this time.   Thanks, Lujean Rave, MSN, RNC-OB Diabetes Coordinator [phone] (8a-5p)

## 2020-01-31 NOTE — Progress Notes (Signed)
NAMEChristiaan Hampton, MRN:  [id number], DOB:  08-06-1966, LOS: 10 ADMISSION DATE:  02/10/2020, CONSULTATION DATE:  2/4 REFERRING MD:  Dr. Jarvis Hampton, CHIEF COMPLAINT:  COVID 19 pneumonia   Brief History    54 y/o with admitted on 2/2 with severe COVID 19 pneumonia. Intubated on 2/5, placed in prone position. Bradycardic cardiac arrest on 2/7.   Past Medical History  DM2 HTN HLD Obesity  Significant Hospital Events   2/2 Admit 2/5 Intubated 2/6 Proned, paralyzed 2/7 Developed hematuria, bradycardic event, near arrest 2/8 ETT exchanged  Consults:  PCCM  Procedures:  OETT 2/5 >> Lt IJ CVL 2/5 >> Rt radial A line 2/6 >>  Significant Diagnostic Tests:  TTE 2/9-LV function not able to be assessed, moderate LVH.  Normal LA, RV size and function, RA.  Normal RV size with greater than 50% variability.  Trace TR, otherwise normal valves.  Micro Data:  Blood cultures 2/2-negative Blood cultures 2/9>>CONS 1/3 Trach aspirate 2/9>> moderate PMNs, rare yeast, rare GPC MRSA nares 2/8- negative  Antimicrobials:  Convalescent plasma 2/2 Tocilizumab 2/2 Azithromycin 2/2 >> 2/5  Ceftriaxone 2/2 >> 2/6 Remdesivir 2/2 >> 2/6  Solu-Medrol 2/2 >   Interim history/subjective:   Remained under NMB overnight. Remains with very high vent requirements, not at ARDS goals.   Objective   Blood pressure 110/66, pulse 90, temperature 99 F (37.2 C), resp. rate (!) 32, height 5\' 10"  (1.778 m), weight (!) 180.1 kg, SpO2 96 %.    Vent Mode: PRVC FiO2 (%):  [50 %-100 %] 50 % Set Rate:  [32 bmp] 32 bmp Vt Set:  [530 mL] 530 mL PEEP:  [18 cmH20-20 cmH20] 20 cmH20 Plateau Pressure:  [35 cmH20-38 cmH20] 35 cmH20   Intake/Output Summary (Last 24 hours) at 01/31/2020 1027 Last data filed at 01/31/2020 1002 Gross per 24 hour  Intake 7907.89 ml  Output 3125 ml  Net 4782.89 ml   Filed Weights   01/28/20 0500 01/29/20 0500 01/30/20 0437  Weight: (!) 175 kg (!) 179 kg (!) 180.1 kg     Examination:  General: Critically ill-appearing man, intubated, sedated, under neuromuscular blockade HENT: [place]/AT, eyes anicteric with mild scleral edema, ETT in place. PULM: Synchronous with the vent, distant breath sounds but clear to auscultation bilaterally, diminished in the bases.   CV: Regular rate and rhythm, no murmurs GI: Obese, soft, hypoactive bowel sounds.  Nondistended. GU: Foley draining yellow urine MSK: Minimal lower extremity edema, no clubbing or cyanosis Neuro: RASS -5, under NMB.  Pupils equal and reactive Derm: No rashes or bruising, no foot wounds.   Vent peak pressure-40, plateau 38. PRVC 35/510/20/1 100%   Resolved Hospital Problem list     Assessment & Plan:  COVID 19 pneumonia Severe ARDS, P:F 53 today Compensated hypercarbia> likely severe dead space from ARDS Likely acute lobar pneumonia on the left Possible aspiration episode with medications suctioned out of pharynx today, no tube feeds suctioned. No TF or unexpected fluids in the bronchi today. -Continue full vent support-unfortunately unable to meet pertinent goals.  Continue low tidal volume ventilation, 4 to 8 cc/kg ideal body weight.  Permissive hypercapnia okay.  Goal PaO2 > 55.  Unable to meet goal plateau <30 and driving pressure 01-11-1989 -Unfortunately the risk of proning outweighs potential benefits given his previous severe bradycardic episode and inability to tolerate movement with usual care. -Continue aggressive diuresis -Continue Nimbex infusion and aggressive sedation with goal RASS -5 -Continue holding tube feeds.  Restart enteral free water. -  Continues to need A- line for serial ABGs as saturations have been less reliable. -Goal net negative fluid balance  Need for sedation -RASS goal -5 -Continue Dilaudid and Versed infusions plus enteral medications  Possible Gram-positive bacteremia, 1/3 bottles coag negative staph, likely -Discontinue vancomycin -Continue  cefepime.  Shock-septic versus due to sedation.  Bedside echo with hyperdynamic function, no RV dilation. -Continue broad-spectrum antibiotics given severe critical illness -Continue vasopressors as required to maintain MAP greater than 65  Sinus tachycardia> likely due to fevers and high metabolic demands.  Tachycardia improved today. Cardiac arrest, bradycardia, in prone position on 2/7 CVP 21.  No evidence of cardiomyopathy on echocardiogram. -Continue cooling blanket and acetaminophen as needed for fevers -Continue broad-spectrum antibiotics  DM2 with hyperglycemia-remains uncontrolled -Continue linagliptin -Increase Lantus to 10 units twice daily -Continue short acting insulin, 15 units every 4 hours with plans to hold if BG less than 150.  Sliding scale insulin as needed. -Goal BG 140-180 while admitted to the ICU.  Hematuria > resolved. Unknown etiology.  Previously requiring CBI. -Continue to monitor clinically for bleeding -Daily CBC  AKI> improved since admission, stable -Continue to monitor -Strict I's/O -Renally dose meds and avoid nephrotoxic meds  Hyperkalemia -Continue Lokelma twice daily -Lasix -Continue to monitor  Prognosis remains guarded given severity of ARDS and inability to tolerate optimal treatments.   Best practice:  Diet: tube feeding Pain/Anxiety/Delirium protocol (if indicated): Ordered VAP protocol (if indicated): yes DVT prophylaxis: Lovenox GI prophylaxis: Pantoprazole for stress ulcer prophylaxis Glucose control: SSI, linagliptin Mobility: bed rest Code Status: full- sisters have confirmed this Family Communication:  Updated both sisters- they would like to do a video visit today Disposition:  ICU  Labs   CBC: Recent Labs  Lab 01/25/20 0505 01/25/20 0505 01/26/20 0450 01/26/20 0452 01/26/20 1935 01/26/20 1935 01/27/20 0445 01/27/20 0537 01/28/20 0415 01/28/20 0924 01/29/20 0612 01/29/20 0612 01/30/20 0536 01/30/20 0536  01/30/20 1225 01/30/20 1308 01/30/20 1538 01/31/20 0421 01/31/20 0422  WBC 23.4*   < > 23.3*   < > 29.3*   < > 30.0*  --  27.7*  --  28.5*  --  27.2*  --   --   --   --  18.6*  --   NEUTROABS 19.3*  --  18.7*  --  24.4*  --   --   --  21.9*  --  23.5*  --   --   --   --   --   --   --   --   HGB 12.4*   < > 13.3   < > 13.6   < > 13.3   < > 13.4   < > 12.0*   < > 12.1*   < > 12.9* 12.2* 12.9* 11.3* 10.2*  HCT 41.1   < > 44.1   < > 44.6   < > 44.4   < > 45.9   < > 43.4   < > 44.2   < > 38.0* 36.0* 38.0* 39.9 30.0*  MCV 97.2   < > 99.8   < > 98.0   < > 99.1  --  100.4*  --  105.6*  --  107.3*  --   --   --   --  106.7*  --   PLT 372   < > 343   < > 399   < > 437*  --  401*  --  321  --  256  --   --   --   --  205  --    < > = values in this interval not displayed.    Basic Metabolic Panel: Recent Labs  Lab 01/26/20 0450 01/26/20 0452 01/26/20 1935 01/26/20 1935 01/27/20 0445 01/27/20 0537 01/28/20 0415 01/28/20 0924 01/29/20 0612 01/29/20 0612 01/29/20 1305 01/29/20 1305 01/30/20 0536 01/30/20 0536 01/30/20 1225 01/30/20 1308 01/30/20 1538 01/31/20 0421 01/31/20 0422  NA 142   < > 142   < > 145   < > 152*   < > 158*   < > 154*   < > 160*   < > 159* 158* 158* 156* 157*  K 4.9   < > 4.6   < > 4.6   < > 4.7   < > 5.1   < > 5.5*   < > 5.2*   < > 5.5* 5.5* 5.6* 5.3* 4.6  CL 101   < > 99   < > 104   < > 109  --  118*  --  116*  --  116*  --   --   --   --  116*  --   CO2 29   < > 26   < > 30   < > 34*  --  33*  --  33*  --  33*  --   --   --   --  34*  --   GLUCOSE 181*   < > 211*   < > 235*   < > 258*  --  288*  --  339*  --  228*  --   --   --   --  259*  --   BUN 90*   < > 97*   < > 91*   < > 73*  --  89*  --  90*  --  62*  --   --   --   --  58*  --   CREATININE 3.14*   < > 3.23*   < > 2.34*   < > 1.71*  --  2.41*  --  2.41*  --  1.26*  --   --   --   --  1.39*  --   CALCIUM 8.6*   < > 8.5*   < > 8.9   < > 9.3  --  8.7*  --  8.3*  --  8.9  --   --   --   --  8.6*  --   MG  3.1*  --  3.2*  --  3.3*  --   --   --   --   --   --   --   --   --   --   --   --   --   --   PHOS 9.5*  --  8.4*  --  5.9*  --   --   --   --   --   --   --   --   --   --   --   --   --   --    < > = values in this interval not displayed.   GFR: Estimated Creatinine Clearance: 99.5 mL/min (A) (by C-G formula based on SCr of 1.39 mg/dL (H)). Recent Labs  Lab 01/26/20 1935 01/27/20 0445 01/28/20 0415 01/28/20 1655 01/28/20 2125 01/29/20 0612 01/29/20 1305 01/30/20 0536 01/31/20 0421  WBC 29.3*   < > 27.7*  --   --  28.5*  --  27.2* 18.6*  LATICACIDVEN 1.6  --   --  2.8* 2.2*  --  2.0*  --   --    < > = values in this interval not displayed.    Liver Function Tests: Recent Labs  Lab 01/26/20 0450 01/26/20 1935 01/29/20 0612 01/30/20 0536 01/31/20 0421  AST 65* 58* 65* 67* 43*  ALT 43 47* 74* 78* 61*  ALKPHOS 66 63 51 61 59  BILITOT 0.6 0.3 0.7 0.4 0.5  PROT 7.4 7.3 6.3* 6.6 6.2*  ALBUMIN 3.2* 3.1* 2.7* 2.9* 2.6*   No results for input(s): LIPASE, AMYLASE in the last 168 hours. Recent Labs  Lab 01/26/20 1935  AMMONIA 60*    ABG    Component Value Date/Time   PHART 7.303 (L) 01/31/2020 0422   PCO2ART 64.0 (H) 01/31/2020 0422   PO2ART 53.0 (L) 01/31/2020 0422   HCO3 31.5 (H) 01/31/2020 0422   TCO2 33 (H) 01/31/2020 0422   O2SAT 80.0 01/31/2020 0422     Coagulation Profile: Recent Labs  Lab 01/26/20 1935  INR 1.2    Cardiac Enzymes: Recent Labs  Lab 01/29/20 1305 01/30/20 0536  CKTOTAL 1,270* 1,196*    HbA1C: Hgb A1c MFr Bld  Date/Time Value Ref Range Status  01/23/2020 03:45 AM 7.0 (H) 4.8 - 5.6 % Final    Comment:    (NOTE) Pre diabetes:          5.7%-6.4% Diabetes:              >6.4% Glycemic control for   <7.0% adults with diabetes     CBG: Recent Labs  Lab 01/30/20 1702 01/30/20 1957 01/31/20 0027 01/31/20 0409 01/31/20 0820  GLUCAP 263* 227* 247* 231* 219*    This patient is critically ill with multiple organ system  failure which requires frequent high complexity decision making, assessment, support, evaluation, and titration of therapies. This was completed through the application of advanced monitoring technologies and extensive interpretation of multiple databases. During this encounter critical care time was devoted to patient care services described in this note for 60 minutes.  Steffanie Dunn, DO 01/31/20 10:49 AM  Pulmonary & Critical Care

## 2020-01-31 NOTE — Progress Notes (Signed)
Assisted tele visit to patient with family member.  Lavren Lewan Ann, RN  

## 2020-02-01 ENCOUNTER — Inpatient Hospital Stay (HOSPITAL_COMMUNITY): Payer: Medicare HMO

## 2020-02-01 LAB — COMPREHENSIVE METABOLIC PANEL
ALT: 46 U/L — ABNORMAL HIGH (ref 0–44)
AST: 31 U/L (ref 15–41)
Albumin: 2.6 g/dL — ABNORMAL LOW (ref 3.5–5.0)
Alkaline Phosphatase: 60 U/L (ref 38–126)
Anion gap: 8 (ref 5–15)
BUN: 56 mg/dL — ABNORMAL HIGH (ref 6–20)
CO2: 31 mmol/L (ref 22–32)
Calcium: 8.1 mg/dL — ABNORMAL LOW (ref 8.9–10.3)
Chloride: 115 mmol/L — ABNORMAL HIGH (ref 98–111)
Creatinine, Ser: 1.34 mg/dL — ABNORMAL HIGH (ref 0.61–1.24)
GFR calc Af Amer: >60 mL/min (ref >60)
GFR calc non Af Amer: 60 mL/min — ABNORMAL LOW (ref >60)
Glucose, Bld: 159 mg/dL — ABNORMAL HIGH (ref 70–99)
Potassium: 5 mmol/L (ref 3.5–5.1)
Sodium: 154 mmol/L — ABNORMAL HIGH (ref 135–145)
Total Bilirubin: 0.6 mg/dL (ref 0.3–1.2)
Total Protein: 6.1 g/dL — ABNORMAL LOW (ref 6.5–8.1)

## 2020-02-01 LAB — POCT I-STAT 7, (LYTES, BLD GAS, ICA,H+H)
Acid-Base Excess: 6 mmol/L — ABNORMAL HIGH (ref 0.0–2.0)
Acid-Base Excess: 4 mmol/L — ABNORMAL HIGH (ref 0.0–2.0)
Bicarbonate: 34.9 mmol/L — ABNORMAL HIGH (ref 20.0–28.0)
Bicarbonate: 32.6 mmol/L — ABNORMAL HIGH (ref 20.0–28.0)
Calcium, Ion: 1.24 mmol/L (ref 1.15–1.40)
Calcium, Ion: 1.19 mmol/L (ref 1.15–1.40)
HCT: 34 % — ABNORMAL LOW (ref 39.0–52.0)
HCT: 32 % — ABNORMAL LOW (ref 39.0–52.0)
Hemoglobin: 11.6 g/dL — ABNORMAL LOW (ref 13.0–17.0)
Hemoglobin: 10.9 g/dL — ABNORMAL LOW (ref 13.0–17.0)
O2 Saturation: 74 %
O2 Saturation: 54 %
Patient temperature: 38.2
Patient temperature: 37.9
Potassium: 5.1 mmol/L (ref 3.5–5.1)
Potassium: 5.2 mmol/L — ABNORMAL HIGH (ref 3.5–5.1)
Sodium: 154 mmol/L — ABNORMAL HIGH (ref 135–145)
Sodium: 156 mmol/L — ABNORMAL HIGH (ref 135–145)
TCO2: 37 mmol/L — ABNORMAL HIGH (ref 22–32)
TCO2: 35 mmol/L — ABNORMAL HIGH (ref 22–32)
pCO2 arterial: 75.7 mmHg (ref 32.0–48.0)
pCO2 arterial: 74.6 mmHg (ref 32.0–48.0)
pH, Arterial: 7.277 — ABNORMAL LOW (ref 7.350–7.450)
pH, Arterial: 7.253 — ABNORMAL LOW (ref 7.350–7.450)
pO2, Arterial: 50 mmHg — ABNORMAL LOW (ref 83.0–108.0)
pO2, Arterial: 36 mmHg — CL (ref 83.0–108.0)

## 2020-02-01 LAB — GLUCOSE, CAPILLARY
Glucose-Capillary: 162 mg/dL — ABNORMAL HIGH (ref 70–99)
Glucose-Capillary: 147 mg/dL — ABNORMAL HIGH (ref 70–99)
Glucose-Capillary: 161 mg/dL — ABNORMAL HIGH (ref 70–99)
Glucose-Capillary: 152 mg/dL — ABNORMAL HIGH (ref 70–99)

## 2020-02-01 LAB — CBC
HCT: 40.8 % (ref 39.0–52.0)
Hemoglobin: 11.1 g/dL — ABNORMAL LOW (ref 13.0–17.0)
MCH: 29.3 pg (ref 26.0–34.0)
MCHC: 27.2 g/dL — ABNORMAL LOW (ref 30.0–36.0)
MCV: 107.7 fL — ABNORMAL HIGH (ref 80.0–100.0)
Platelets: 180 10*3/uL (ref 150–400)
RBC: 3.79 MIL/uL — ABNORMAL LOW (ref 4.22–5.81)
RDW: 14.7 % (ref 11.5–15.5)
WBC: 25.2 10*3/uL — ABNORMAL HIGH (ref 4.0–10.5)
nRBC: 0.1 % (ref 0.0–0.2)

## 2020-02-01 MED ORDER — INSULIN GLARGINE 100 UNIT/ML ~~LOC~~ SOLN
20.0000 [IU] | Freq: Two times a day (BID) | SUBCUTANEOUS | Status: DC
  Filled 2020-02-01: qty 0.2

## 2020-02-01 MED ORDER — VITAL HIGH PROTEIN PO LIQD
1000.0000 mL | ORAL | Status: DC
  Administered 2020-02-01: 1000 mL

## 2020-02-01 MED ORDER — SENNOSIDES-DOCUSATE SODIUM 8.6-50 MG PO TABS
1.0000 | ORAL_TABLET | Freq: Every day | ORAL | Status: DC
  Administered 2020-02-01: 1
  Filled 2020-02-01: qty 1

## 2020-02-02 LAB — CULTURE, BLOOD (ROUTINE X 2)
Culture: NO GROWTH
Special Requests: ADEQUATE

## 2020-02-03 LAB — GLUCOSE, CAPILLARY: Glucose-Capillary: 136 mg/dL — ABNORMAL HIGH (ref 70–99)

## 2020-02-17 NOTE — Progress Notes (Signed)
Infusion bag of dilaudid removed from alaris pump. 69mL of dilaudid wasted in stericycle with 2nd RN, [employer].

## 2020-02-17 NOTE — Progress Notes (Signed)
Spoke with pharmacy, unopened bag of dilaudid unable to be reused/cleaned off for reuse. Full bag wasted in stericycle with 2nd RN, [employer]. wasted.

## 2020-02-17 NOTE — Progress Notes (Signed)
This RN started shift and walked into patient room were patient had expired at 1905. Post Mortem checklist completed, family notified, awaiting funeral home choice. Dr. Katrinka Blazing notified, patient placement notified. [facility] notified. Belongings placed with patient on stretcher.

## 2020-02-17 NOTE — Progress Notes (Signed)
Assisted tele visit to patient with family member.  Natesha Hassey P, RN  

## 2020-02-17 NOTE — Progress Notes (Signed)
Spoke with sisters, patient actively dying.  There is no further therapeutic options that will meaningfully prolong this patient's life.  I agree with Dr. Chestine Spore that chest compressions and shocks are not indicated.  Death expected sometime tonight.  Sisters would like to know of time of passing.  Myrla Halsted MD PCCM

## 2020-02-17 NOTE — Progress Notes (Signed)
Spoke with the patient's sisters and gave update.

## 2020-02-17 NOTE — Progress Notes (Signed)
NAMETommaso Hampton, MRN:  [id number], DOB:  [birth year], LOS: 11 ADMISSION DATE:  Feb 16, 2020, CONSULTATION DATE:  2/4 REFERRING MD:  Dr. Jarvis Newcomer, CHIEF COMPLAINT:  COVID 19 pneumonia   Brief History    54 y/o with admitted on 2/2 with severe COVID 19 pneumonia. Intubated on 2/5, placed in prone position. Bradycardic cardiac arrest on 2/7.  Past Medical History  DM2 HTN HLD Obesity  Significant Hospital Events   2/2 Admit 2/5 Intubated 2/6 Proned, paralyzed 2/7 Developed hematuria, bradycardic event, near arrest 2/8 ETT exchanged  Consults:  PCCM  Procedures:  ETT 2/5 >> Lt IJ CVL 2/5 >> Rt radial A line 2/6 >>  Significant Diagnostic Tests:  TTE 2/9-LV function not able to be assessed, moderate LVH.  Normal LA, RV size and function, RA.  Normal RV size with greater than 50% variability.  Trace TR, otherwise normal valves.  Micro Data:  Blood cultures 2/2-negative Blood cultures 2/9>>CONS 1/3 Trach aspirate 2/9>> moderate PMNs, rare yeast, rare GPC>> few Candida albicans MRSA nares 2/8- negative   Antimicrobials:  Convalescent plasma 2/2 Tocilizumab 2/2 Azithromycin 2/2 >> 2/5  Ceftriaxone 2/2 >> 2/6 Remdesivir 2/2 >> 2/6  Solu-Medrol 2/2 >   Interim history/subjective:   Decreasing oxygen saturations throughout the day without turning or other interventions that preceded it.  Continues requiring high vent support with high pressures.  Objective   Blood pressure 117/62, pulse (!) 115, temperature (!) 101.5 F (38.6 C), resp. rate (!) 32, height 5\' 10"  (1.778 m), weight (!) 180.1 kg, SpO2 (!) 75 %. CVP:  [20 mmHg] 20 mmHg  Vent Mode: PRVC FiO2 (%):  [100 %] 100 % Set Rate:  [32 bmp] 32 bmp Vt Set:  [510 mL] 510 mL PEEP:  [20 cmH20] 20 cmH20 Plateau Pressure:  [35 cmH20-38 cmH20] 35 cmH20   Intake/Output Summary (Last 24 hours) at 02/04/2020 1722 Last data filed at 02/10/2020 1600 Gross per 24 hour  Intake 2687.18 ml  Output 2000 ml  Net 687.18 ml    Filed Weights   01/28/20 0500 01/29/20 0500 01/30/20 0437  Weight: (!) 175 kg (!) 179 kg (!) 180.1 kg    Examination:  General: [name] ill-appearing man, intubated and sedated, under neuromuscular blockade HENT: [place]/AT, eyes anicteric with mild scleral edema.  ETT in place PULM: Synchronous with the vent, distant breath sounds-no wheezing or rhonchi. CV: Tachycardic, regular rhythm GI:, Nontender, nondistended GU: Foley with significant sediment MSK: No clubbing or cyanosis Neuro: RASS -5, under neuromuscular blockade.  PERRL. Derm: No rashes or bruising, no foot wounds.  Unable to examine his backside due to inability to roll.   Vent peak pressure-41, plateau 38. PRVC 35/510/20/ 100%  Bedside ultrasound with bilateral sliding lung & lung pulse  CXR personally reviewed-bilateral airspace disease, no pneumothorax, ET tube in appropriate position  Resolved Hospital Problem list     Assessment & Plan:  COVID 19 pneumonia Severe ARDS, P:F 53 today Compensated hypercarbia> likely severe dead space from ARDS Likely acute lobar pneumonia on the left Possible aspiration episode with medications suctioned out of pharynx today, no tube feeds suctioned. No TF or unexpected fluids in the bronchi today. -Continue full vent support-unfortunately not meeting ARDS goals.-Repeat ABG this afternoon -Stat CXR morning -Unfortunately unable to prone due to risk of cardiac instability and desaturations of previous episodes -Holding additional diuresis -Continue Nimbex infusion and aggressive sedation -Continue enteral free water, restart tube feeds at reduced dose -Continue sling A-line for serial ABGs given severity of  respiratory failure and difficulty with correlation of saturations -Very poor prognosis  Need for sedation -Goal RASS -5 -Continue Dilaudid and Versed drips and enteral meds  Possible Gram-positive bacteremia, 1/3 bottles coag negative staph, likely contaminant -Continue  cefepime  Shock-septic versus due to sedation.  Bedside echo with hyperdynamic function, no RV dilation. -Continue broad-spectrum antibiotics given severe critical illness -Continue vasopressors as required to maintain MAP greater than 65, using Neo-Synephrine in addition to norepinephrine to limit tachycardic response  Sinus tachycardia> likely due to fevers and high metabolic demands.  Tachycardia improved today. Cardiac arrest, bradycardia, in prone position on 2/7 CVP 21.  No evidence of cardiomyopathy on echocardiogram. -Continue broad-spectrum antibiotics and interventions to address fevers  DM2 with hyperglycemia-now controlled -Continue linagliptin -Increase insulin to 20 units twice daily. Restarting tube feeds at half rate -Continue short acting insulin -Continue sliding scale insulin -Goal BG 140-180 while admitted to the ICU  Hematuria > resolved. Unknown etiology.  Previously requiring CBI. -Continue to monitor clinically for bleeding -Need to contact urology to address catheter follow-up  AKI> improved since admission, stable -Continue to monitor -Strict I/O -Renally dose meds and avoid nephrotoxic meds  Hyperkalemia -Continue Lokelma twice daily -Continue to monitor   I called to update [name]'s sisters regarding his care and tell them my concern that with his deteriorating oxygen saturations despite maximal medical management means that he is unlikely to survive this illness. We will continue to provide aggressive medical care, but his progressive deterioration over the past 2 days without a reversible cause leads me to believe this will not be a survivable illness. They have asked for a second opinion, and I will request that the night physician call to answer additional questions they have and discuss their brother's care.    Best practice:  Diet: tube feeding Pain/Anxiety/Delirium protocol (if indicated): Ordered VAP protocol (if indicated): yes DVT  prophylaxis: Lovenox GI prophylaxis: Pantoprazole for stress ulcer prophylaxis Glucose control: SSI, linagliptin Mobility: bed rest Code Status: full- sisters have confirmed this Family Communication:  Updated both sisters Disposition:  ICU  Labs   CBC: Recent Labs  Lab 01/26/20 0450 01/26/20 0452 01/26/20 1935 01/27/20 0445 01/28/20 0415 01/28/20 0924 01/29/20 0612 01/29/20 0612 01/30/20 0536 01/30/20 1225 01/31/20 0421 01/31/20 0422 February 25, 2020 0119 25-Feb-2020 0436 2020-02-25 1719  WBC 23.3*   < > 29.3*   < > 27.7*  --  28.5*  --  27.2*  --  18.6*  --   --  25.2*  --   NEUTROABS 18.7*  --  24.4*  --  21.9*  --  23.5*  --   --   --   --   --   --   --   --   HGB 13.3   < > 13.6   < > 13.4   < > 12.0*   < > 12.1*   < > 11.3* 10.2* 11.6* 11.1* 10.9*  HCT 44.1   < > 44.6   < > 45.9   < > 43.4   < > 44.2   < > 39.9 30.0* 34.0* 40.8 32.0*  MCV 99.8   < > 98.0   < > 100.4*  --  105.6*  --  107.3*  --  106.7*  --   --  107.7*  --   PLT 343   < > 399   < > 401*  --  321  --  256  --  205  --   --  180  --    < > = values in this interval not displayed.    Basic Metabolic Panel: Recent Labs  Lab 01/26/20 0450 01/26/20 0452 01/26/20 1935 01/26/20 1935 01/27/20 0445 01/27/20 0537 01/29/20 0612 01/29/20 0612 01/29/20 1305 01/29/20 1305 01/30/20 0536 01/30/20 1225 01/31/20 0421 01/31/20 0422 01/30/2020 0119 02/05/2020 0436 02/04/2020 1719  NA 142   < > 142   < > 145   < > 158*   < > 154*   < > 160*   < > 156* 157* 154* 154* 156*  K 4.9   < > 4.6   < > 4.6   < > 5.1   < > 5.5*   < > 5.2*   < > 5.3* 4.6 5.1 5.0 5.2*  CL 101   < > 99   < > 104   < > 118*  --  116*  --  116*  --  116*  --   --  115*  --   CO2 29   < > 26   < > 30   < > 33*  --  33*  --  33*  --  34*  --   --  31  --   GLUCOSE 181*   < > 211*   < > 235*   < > 288*  --  339*  --  228*  --  259*  --   --  159*  --   BUN 90*   < > 97*   < > 91*   < > 89*  --  90*  --  62*  --  58*  --   --  56*  --   CREATININE 3.14*   <  > 3.23*   < > 2.34*   < > 2.41*  --  2.41*  --  1.26*  --  1.39*  --   --  1.34*  --   CALCIUM 8.6*   < > 8.5*   < > 8.9   < > 8.7*  --  8.3*  --  8.9  --  8.6*  --   --  8.1*  --   MG 3.1*  --  3.2*  --  3.3*  --   --   --   --   --   --   --   --   --   --   --   --   PHOS 9.5*  --  8.4*  --  5.9*  --   --   --   --   --   --   --   --   --   --   --   --    < > = values in this interval not displayed.   GFR: Estimated Creatinine Clearance: 103.2 mL/min (A) (by C-G formula based on SCr of 1.34 mg/dL (H)). Recent Labs  Lab 01/26/20 1935 01/27/20 0445 01/28/20 0415 01/28/20 1655 01/28/20 2125 01/29/20 0612 01/29/20 1305 01/30/20 0536 01/31/20 0421 01/20/2020 0436  WBC 29.3*   < >   < >  --   --  28.5*  --  27.2* 18.6* 25.2*  LATICACIDVEN 1.6  --   --  2.8* 2.2*  --  2.0*  --   --   --    < > = values in this interval not displayed.    Liver Function Tests: Recent Labs  Lab 01/26/20 1935 01/29/20 0612 01/30/20 0536 01/31/20 0421 01/20/2020  0436  AST 58* 65* 67* 43* 31  ALT 47* 74* 78* 61* 46*  ALKPHOS 63 51 61 59 60  BILITOT 0.3 0.7 0.4 0.5 0.6  PROT 7.3 6.3* 6.6 6.2* 6.1*  ALBUMIN 3.1* 2.7* 2.9* 2.6* 2.6*   No results for input(s): LIPASE, AMYLASE in the last 168 hours. Recent Labs  Lab 01/26/20 1935  AMMONIA 60*    ABG    Component Value Date/Time   PHART 7.253 (L) 02/10/2020 1719   PCO2ART 74.6 (HH) 02/05/2020 1719   PO2ART 36.0 (LL) 01/20/2020 1719   HCO3 32.6 (H) 02/05/2020 1719   TCO2 35 (H) 02/15/2020 1719   O2SAT 54.0 02/06/2020 1719     Coagulation Profile: Recent Labs  Lab 01/26/20 1935  INR 1.2    Cardiac Enzymes: Recent Labs  Lab 01/29/20 1305 01/30/20 0536  CKTOTAL 1,270* 1,196*    HbA1C: Hgb A1c MFr Bld  Date/Time Value Ref Range Status  01/23/2020 03:45 AM 7.0 (H) 4.8 - 5.6 % Final    Comment:    (NOTE) Pre diabetes:          5.7%-6.4% Diabetes:              >6.4% Glycemic control for   <7.0% adults with diabetes      CBG: Recent Labs  Lab 01/31/20 2325 01/22/2020 0412 02/12/2020 0811 02/10/2020 1202 02/08/2020 1526  GLUCAP 165* 162* 147* 161* 152*    This patient is critically ill with multiple organ system failure which requires frequent high complexity decision making, assessment, support, evaluation, and titration of therapies. This was completed through the application of advanced monitoring technologies and extensive interpretation of multiple databases. During this encounter critical care time was devoted to patient care services described in this note for 60 minutes.  Steffanie Dunn, DO 01/26/2020 5:35 PM [facility]

## 2020-02-17 NOTE — Death Summary Note (Signed)
Date of Admission: 01-28-20 Date of Death: 2020-02-08 Diagnoses: COVID Pneumonia Hospital Course: Patient was admitted to hospital with typical URI symptoms of COVID.  Despite maximal aggressive care including intubation, pressors, proning, steroids, remdesivir, actemra, he continued to deteriorate and succumbed to his illness on 08-Feb-2020.  Myrla Halsted MD PCCM

## 2020-02-17 NOTE — Progress Notes (Signed)
Called and left message on sister Sherry's cell phone (emergency contact number) with phone number ere at patient's bedside.  Let her I would answer all questions and give any updates.

## 2020-02-17 DEATH — deceased

## 2020-10-31 IMAGING — DX DG CHEST 1V PORT
2 series · 2 of 2 positions shown · non-contrast
Comparison: 01/23/2020

CLINICAL DATA: Intubation

EXAM:
PORTABLE CHEST 1 VIEW

[chest ap (1 of 2)]
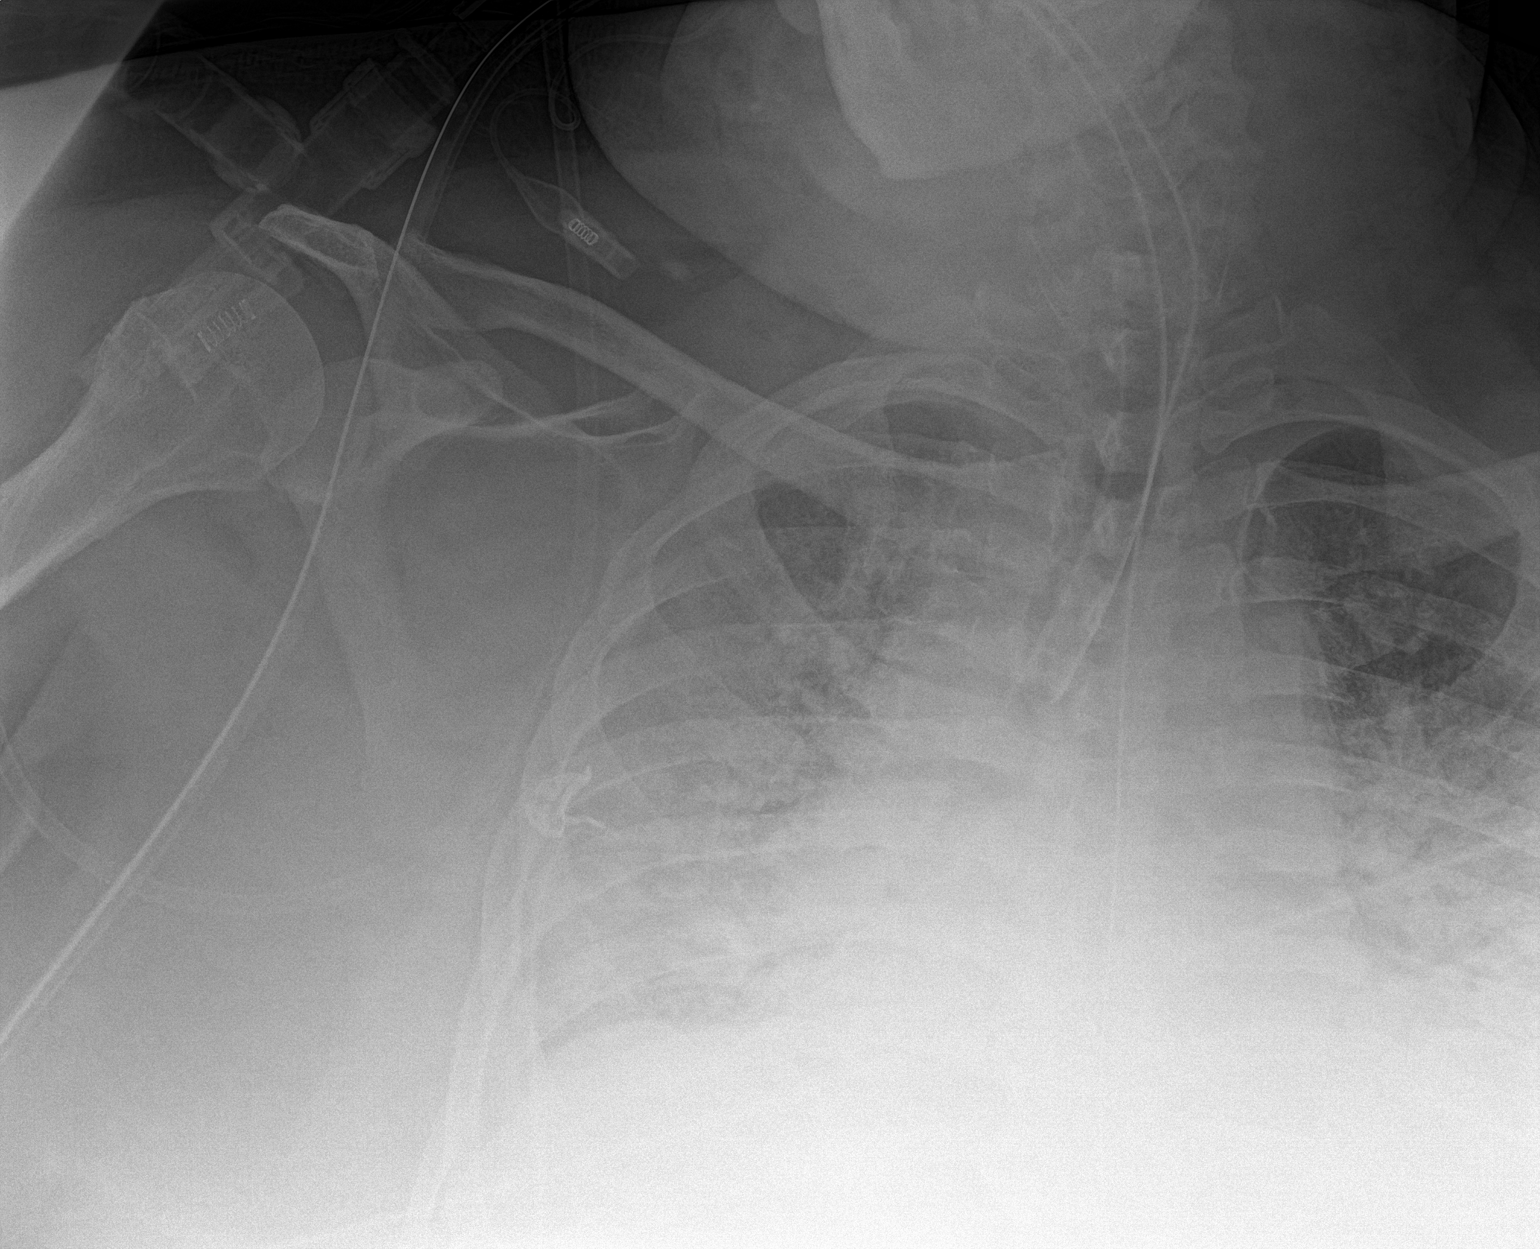

[chest ap (2 of 2)]
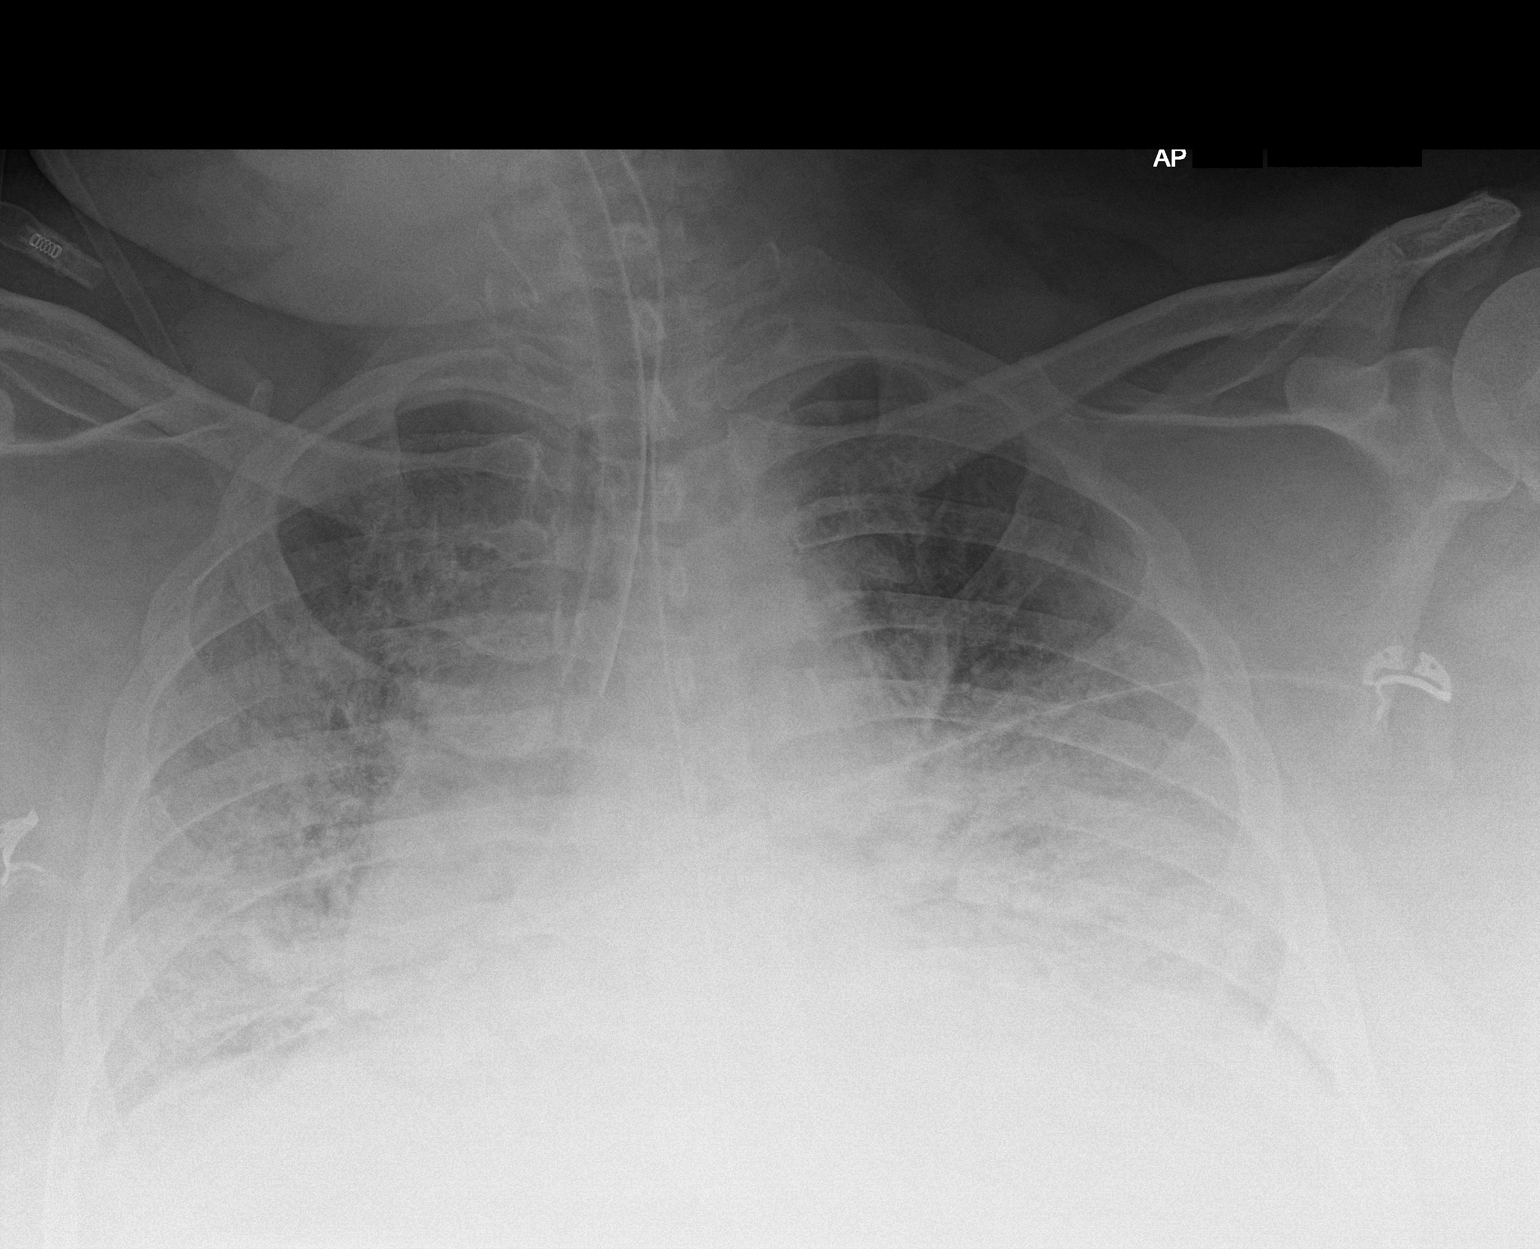

[2 of 2 positions shown; findings below may reference images not displayed]

FINDINGS: Rotated examination. Interval placement of endotracheal tube, which
projects over the lower trachea, approximately 2 cm above the
carina. Interval placement of esophagogastric tube, tip and side
port below the diaphragm. Diffuse bilateral heterogeneous and
interstitial pulmonary opacity. Cardiomegaly.
IMPRESSION: 1. Interval placement of endotracheal tube, which projects over the
lower trachea, approximately 2 cm above the carina. Consider slight
retraction.

2.  Esophagogastric tube with tip and side port below the diaphragm.

3. Diffuse bilateral heterogeneous and interstitial pulmonary
opacity, consistent with [id number] airspace disease.

## 2020-10-31 IMAGING — DX DG CHEST 1V PORT
2 series · 2 of 2 positions shown · non-contrast
Comparison: The earlier same day

CLINICAL DATA: Ventilator dependence.

EXAM:
PORTABLE CHEST 1 VIEW

[chest ap (1 of 2)]
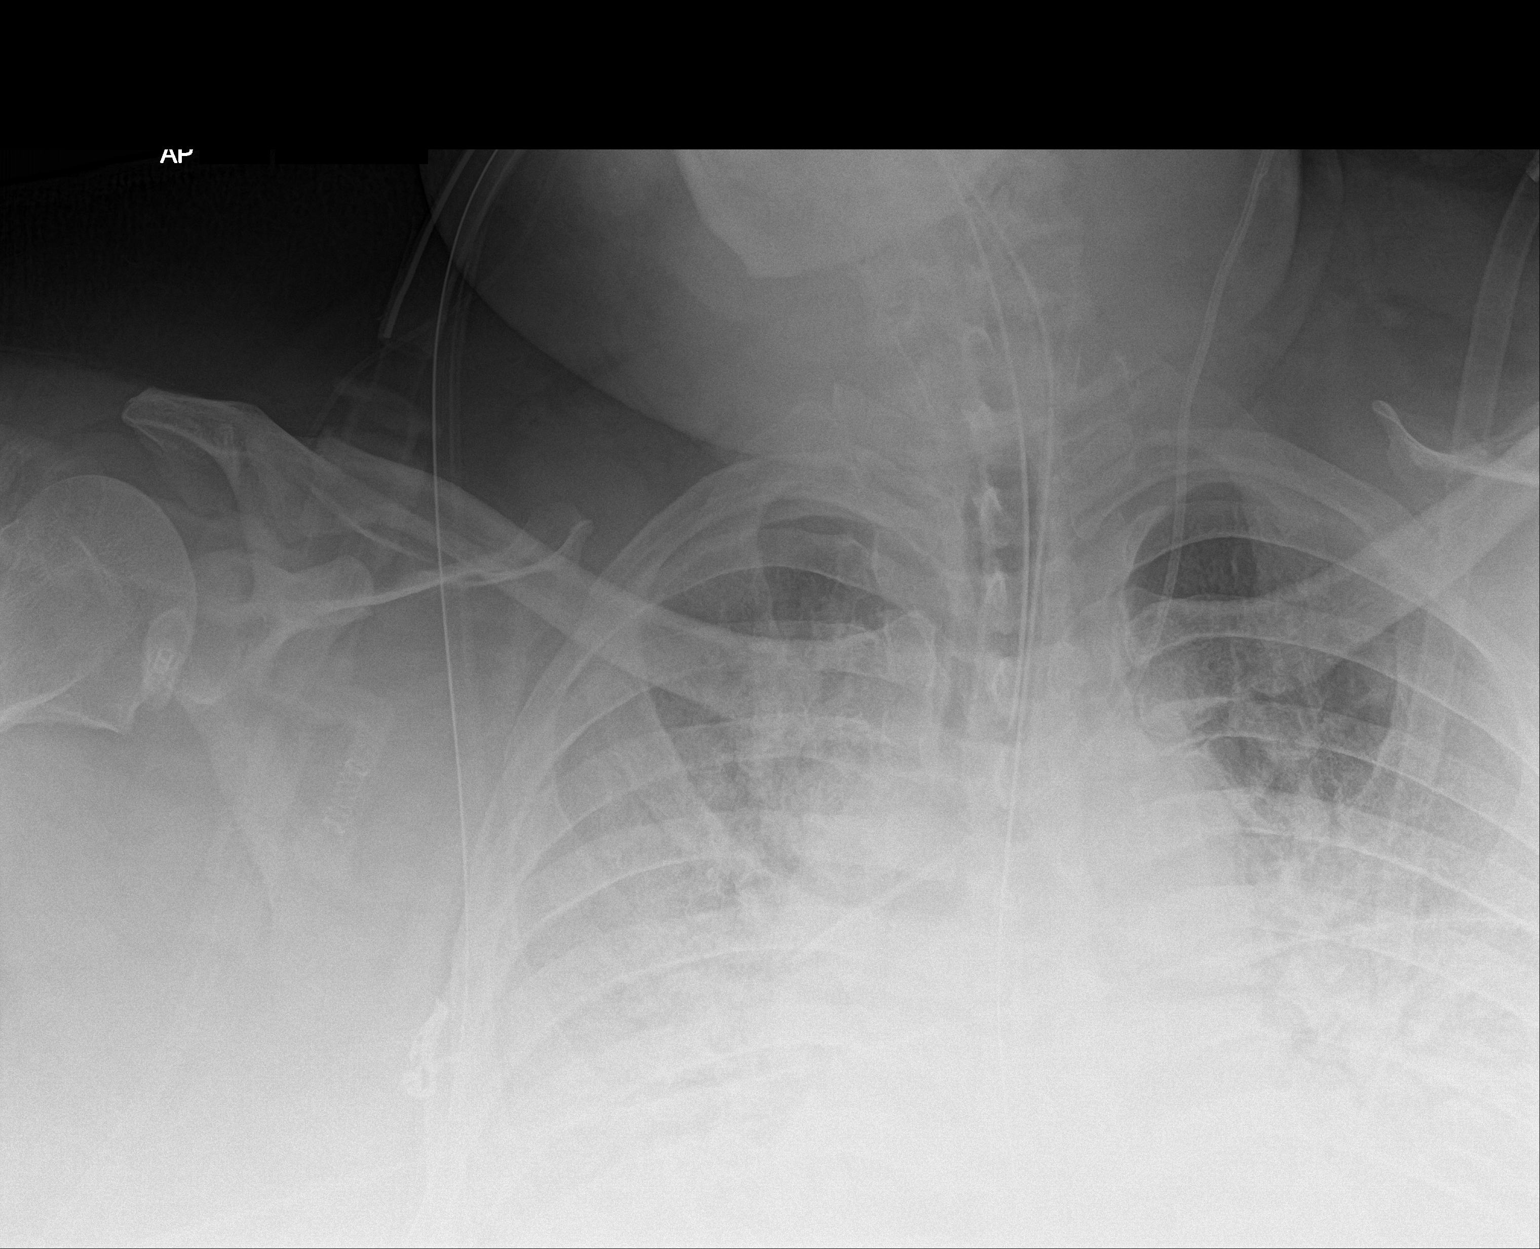

[chest ap (2 of 2)]
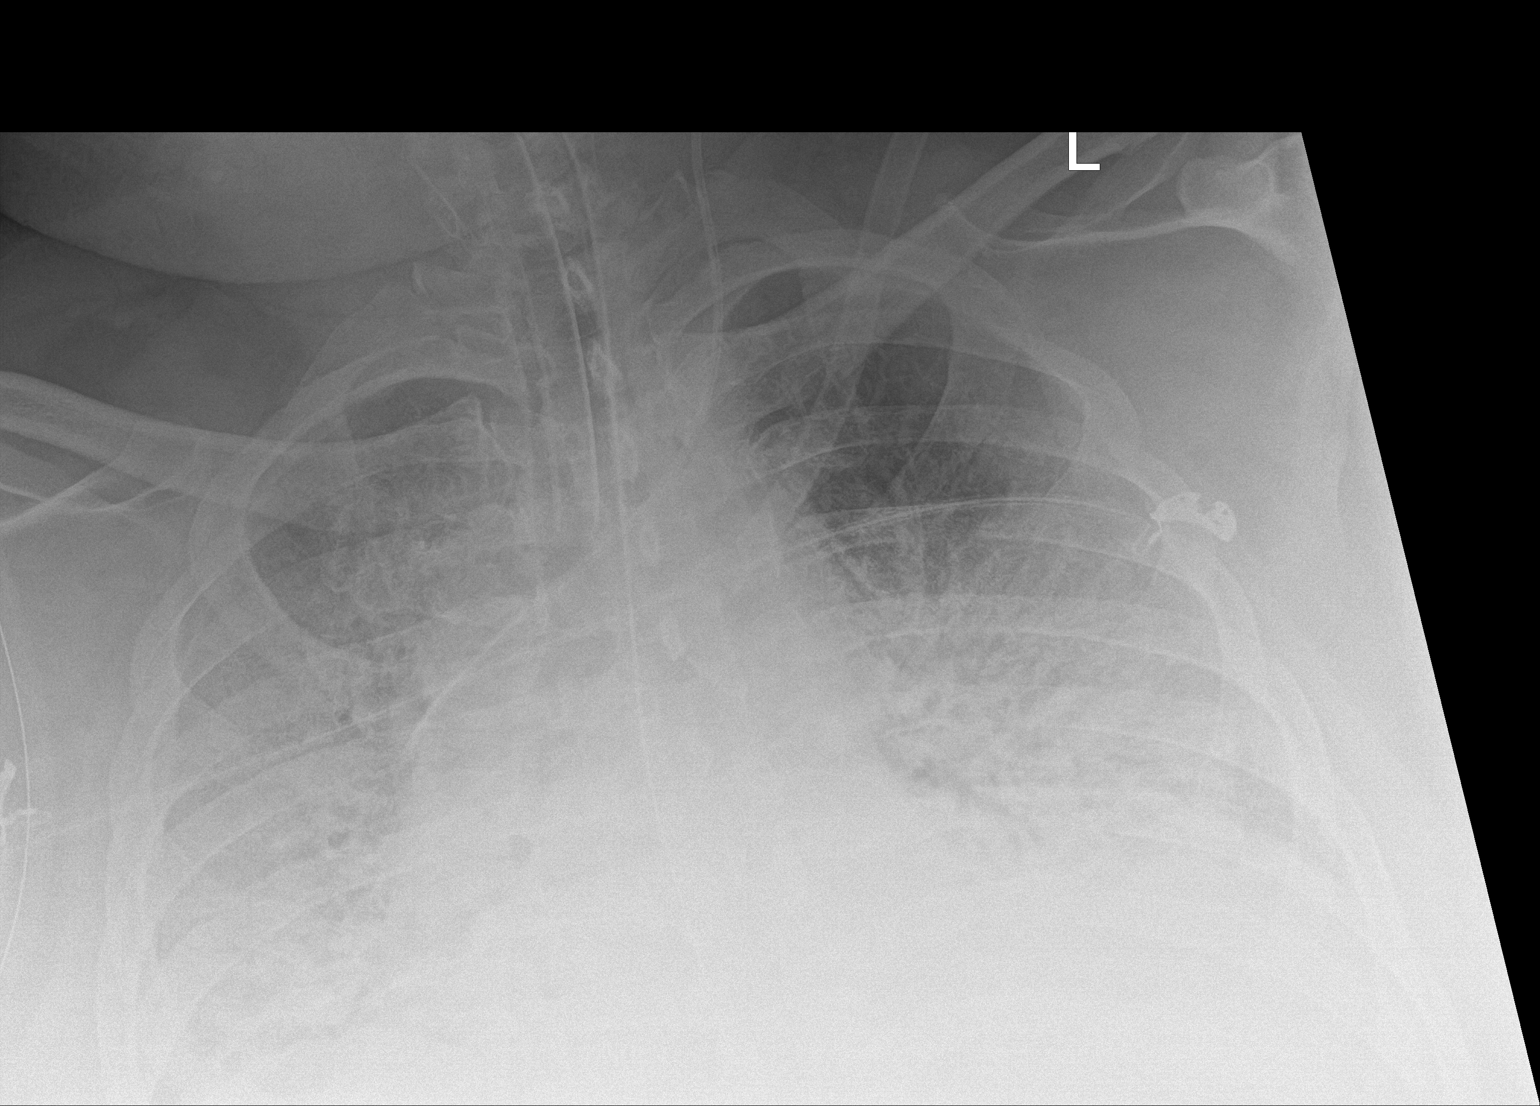

[2 of 2 positions shown; findings below may reference images not displayed]

FINDINGS: [DATE] p.m. endotracheal tube tip 3.0 cm above the base of the carina.
Left IJ central line new in the interval with tip overlying the
proximal SVC. The NG tube passes into the stomach although the
distal tip position is not included on the film. Low volume film
without evidence for pneumothorax. There is diffuse bilateral
airspace disease, similar to prior.
IMPRESSION: 1. Stable diffuse bilateral airspace opacity compatible with
pneumonia.
2. Left IJ central line tip overlies the proximal SVC level. No
evidence for left-sided pneumothorax.

## 2020-11-03 IMAGING — DX DG CHEST 1V PORT
1 series · 1 of 1 positions shown · non-contrast
Comparison: Portable chest 01/26/2020 and earlier.

CLINICAL DATA: 53-year-old male [id number].  Respiratory failure.

EXAM:
PORTABLE CHEST 1 VIEW

[chest ap]
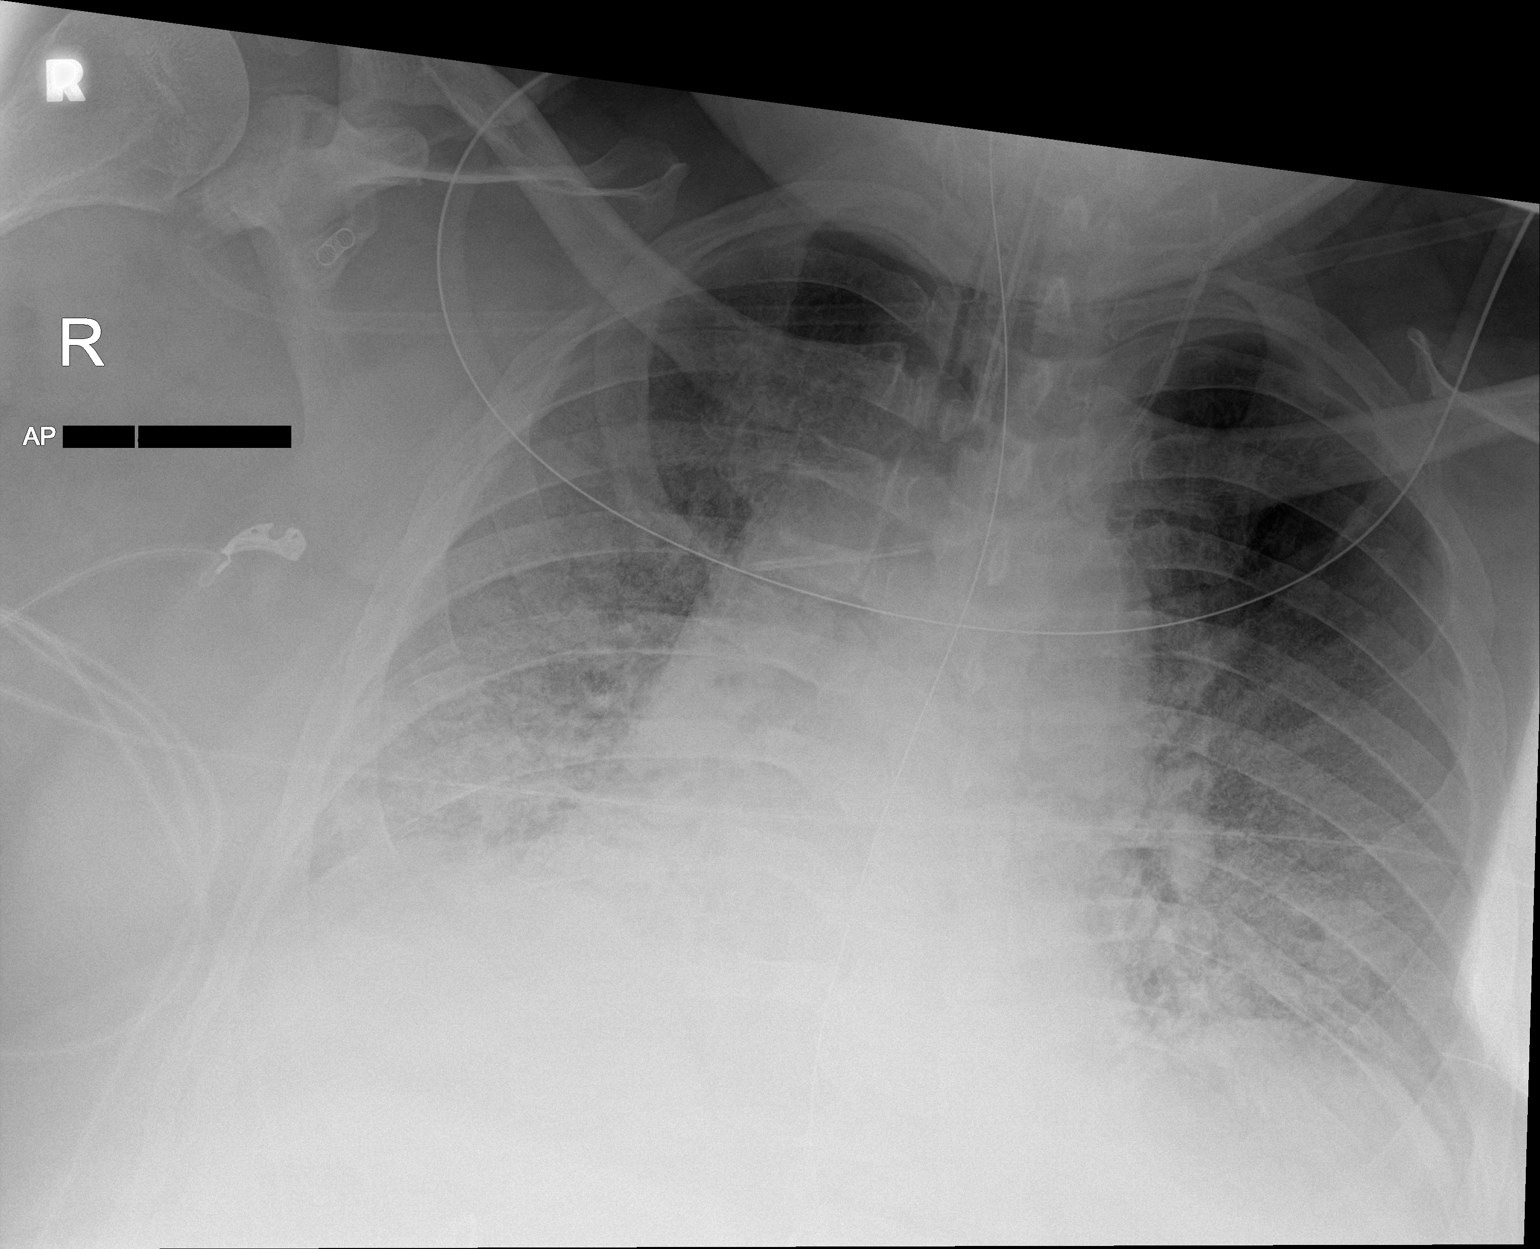

[1 of 1 positions shown; findings below may reference images not displayed]

FINDINGS: Portable AP semi upright view at 1262 hours. Stable endotracheal
tube at the level the clavicles. Enteric tube courses to the
abdomen, tip not included. Stable left IJ central line.

Continued low lung volumes. Stable visible mediastinal contours.
Continued coarse bilateral pulmonary interstitial opacity with
basilar predominance, although less confluent in both lungs since
yesterday. No superimposed pneumothorax. No pleural effusion is
evident. Paucity of bowel gas.
IMPRESSION: 1.  Stable lines and tubes.
2. [id number] pneumonia with mildly improved bilateral lung
ventilation since yesterday, less confluent opacity.
